# Patient Record
Sex: Female | Born: 1960 | Race: White | Hispanic: No | Smoking: Never smoker
Health system: Southern US, Community
[De-identification: ages and names within clinical notes are randomized; demographics above are authoritative.]

## PROBLEM LIST (undated history)

## (undated) DIAGNOSIS — L570 Actinic keratosis: Secondary | ICD-10-CM

## (undated) DIAGNOSIS — K589 Irritable bowel syndrome without diarrhea: Secondary | ICD-10-CM

## (undated) HISTORY — DX: Actinic keratosis: L57.0

---

## 1999-09-15 ENCOUNTER — Other Ambulatory Visit: Admission: RE | Admit: 1999-09-15 | Discharge: 1999-09-15 | Payer: Self-pay | Admitting: Obstetrics and Gynecology

## 2000-09-12 ENCOUNTER — Other Ambulatory Visit: Admission: RE | Admit: 2000-09-12 | Discharge: 2000-09-12 | Payer: Self-pay | Admitting: Obstetrics and Gynecology

## 2001-04-16 ENCOUNTER — Ambulatory Visit (HOSPITAL_COMMUNITY): Admission: RE | Admit: 2001-04-16 | Discharge: 2001-04-16 | Payer: Self-pay | Admitting: Gastroenterology

## 2001-04-16 ENCOUNTER — Encounter: Payer: Self-pay | Admitting: Gastroenterology

## 2001-04-18 ENCOUNTER — Encounter: Payer: Self-pay | Admitting: Gastroenterology

## 2001-04-18 ENCOUNTER — Ambulatory Visit (HOSPITAL_COMMUNITY): Admission: RE | Admit: 2001-04-18 | Discharge: 2001-04-18 | Payer: Self-pay | Admitting: Gastroenterology

## 2001-10-10 ENCOUNTER — Other Ambulatory Visit: Admission: RE | Admit: 2001-10-10 | Discharge: 2001-10-10 | Payer: Self-pay | Admitting: Obstetrics and Gynecology

## 2002-06-04 ENCOUNTER — Encounter: Admission: RE | Admit: 2002-06-04 | Discharge: 2002-06-04 | Payer: Self-pay | Admitting: Gastroenterology

## 2002-06-04 ENCOUNTER — Encounter: Payer: Self-pay | Admitting: Gastroenterology

## 2002-06-18 ENCOUNTER — Other Ambulatory Visit: Admission: RE | Admit: 2002-06-18 | Discharge: 2002-06-18 | Payer: Self-pay | Admitting: Obstetrics and Gynecology

## 2002-09-09 ENCOUNTER — Ambulatory Visit (HOSPITAL_COMMUNITY): Admission: RE | Admit: 2002-09-09 | Discharge: 2002-09-09 | Payer: Self-pay | Admitting: Gastroenterology

## 2002-10-15 ENCOUNTER — Other Ambulatory Visit: Admission: RE | Admit: 2002-10-15 | Discharge: 2002-10-15 | Payer: Self-pay | Admitting: Obstetrics and Gynecology

## 2003-11-09 ENCOUNTER — Other Ambulatory Visit: Admission: RE | Admit: 2003-11-09 | Discharge: 2003-11-09 | Payer: Self-pay | Admitting: Obstetrics and Gynecology

## 2004-01-24 ENCOUNTER — Encounter: Admission: RE | Admit: 2004-01-24 | Discharge: 2004-01-24 | Payer: Self-pay | Admitting: Cardiology

## 2004-04-13 ENCOUNTER — Ambulatory Visit: Payer: Self-pay | Admitting: Internal Medicine

## 2004-04-24 ENCOUNTER — Ambulatory Visit: Payer: Self-pay | Admitting: Family Medicine

## 2004-06-23 ENCOUNTER — Ambulatory Visit: Payer: Self-pay | Admitting: Cardiology

## 2005-01-11 ENCOUNTER — Other Ambulatory Visit: Admission: RE | Admit: 2005-01-11 | Discharge: 2005-01-11 | Payer: Self-pay | Admitting: Obstetrics and Gynecology

## 2005-07-25 ENCOUNTER — Ambulatory Visit: Payer: Self-pay | Admitting: Cardiology

## 2005-08-07 ENCOUNTER — Ambulatory Visit: Payer: Self-pay | Admitting: Cardiology

## 2005-09-14 ENCOUNTER — Ambulatory Visit: Payer: Self-pay | Admitting: Family Medicine

## 2005-09-19 ENCOUNTER — Ambulatory Visit: Payer: Self-pay | Admitting: Cardiology

## 2006-08-28 ENCOUNTER — Ambulatory Visit: Payer: Self-pay | Admitting: Cardiology

## 2006-08-28 LAB — CONVERTED CEMR LAB
ALT: 19 units/L (ref 0–40)
AST: 23 units/L (ref 0–37)
Albumin: 4 g/dL (ref 3.5–5.2)
Alkaline Phosphatase: 50 units/L (ref 39–117)
Bilirubin, Direct: 0.1 mg/dL (ref 0.0–0.3)
Cholesterol: 157 mg/dL (ref 0–200)
HDL: 42.6 mg/dL (ref >39.0)
LDL Cholesterol: 83 mg/dL (ref 0–99)
Total Bilirubin: 0.8 mg/dL (ref 0.3–1.2)
Total CHOL/HDL Ratio: 3.7
Total Protein: 7.3 g/dL (ref 6.0–8.3)
Triglycerides: 155 mg/dL — ABNORMAL HIGH (ref 0–149)
VLDL: 31 mg/dL (ref 0–40)

## 2006-09-06 ENCOUNTER — Ambulatory Visit: Payer: Self-pay | Admitting: Cardiology

## 2007-10-22 ENCOUNTER — Ambulatory Visit: Payer: Self-pay | Admitting: Cardiology

## 2007-10-22 LAB — CONVERTED CEMR LAB
ALT: 21 units/L (ref 0–35)
AST: 28 units/L (ref 0–37)
Albumin: 4 g/dL (ref 3.5–5.2)
Alkaline Phosphatase: 50 units/L (ref 39–117)
Bilirubin, Direct: 0.1 mg/dL (ref 0.0–0.3)
Cholesterol: 163 mg/dL (ref 0–200)
Direct LDL: 94.1 mg/dL
HDL: 40.7 mg/dL (ref >39.0)
Total Bilirubin: 0.7 mg/dL (ref 0.3–1.2)
Total CHOL/HDL Ratio: 4
Total Protein: 7.3 g/dL (ref 6.0–8.3)
Triglycerides: 237 mg/dL (ref 0–149)
VLDL: 47 mg/dL — ABNORMAL HIGH (ref 0–40)

## 2007-10-29 ENCOUNTER — Ambulatory Visit: Payer: Self-pay | Admitting: Cardiology

## 2008-10-07 ENCOUNTER — Telehealth: Payer: Self-pay | Admitting: Cardiology

## 2008-11-05 ENCOUNTER — Ambulatory Visit: Payer: Self-pay | Admitting: Cardiology

## 2008-11-05 LAB — CONVERTED CEMR LAB
ALT: 20 units/L (ref 0–35)
AST: 22 units/L (ref 0–37)
Albumin: 4.4 g/dL (ref 3.5–5.2)
Alkaline Phosphatase: 51 units/L (ref 39–117)
Bilirubin, Direct: 0 mg/dL (ref 0.0–0.3)
Cholesterol: 143 mg/dL (ref 0–200)
HDL: 46.7 mg/dL (ref >39.00)
Hgb A1c MFr Bld: 5.4 % (ref 4.6–6.5)
LDL Cholesterol: 63 mg/dL (ref 0–99)
Total Bilirubin: 1 mg/dL (ref 0.3–1.2)
Total CHOL/HDL Ratio: 3
Total Protein: 7.9 g/dL (ref 6.0–8.3)
Triglycerides: 166 mg/dL — ABNORMAL HIGH (ref 0.0–149.0)
VLDL: 33.2 mg/dL (ref 0.0–40.0)

## 2008-11-08 DIAGNOSIS — E785 Hyperlipidemia, unspecified: Secondary | ICD-10-CM | POA: Insufficient documentation

## 2008-11-11 ENCOUNTER — Ambulatory Visit: Payer: Self-pay | Admitting: Cardiology

## 2009-09-23 ENCOUNTER — Telehealth: Payer: Self-pay | Admitting: Cardiology

## 2009-10-26 ENCOUNTER — Ambulatory Visit: Payer: Self-pay | Admitting: Cardiology

## 2009-10-28 ENCOUNTER — Ambulatory Visit: Payer: Self-pay | Admitting: Cardiology

## 2009-10-28 DIAGNOSIS — I10 Essential (primary) hypertension: Secondary | ICD-10-CM | POA: Insufficient documentation

## 2009-10-28 LAB — CONVERTED CEMR LAB
ALT: 17 units/L (ref 0–35)
AST: 21 units/L (ref 0–37)
Albumin: 4.1 g/dL (ref 3.5–5.2)
Alkaline Phosphatase: 53 units/L (ref 39–117)
Bilirubin, Direct: 0.1 mg/dL (ref 0.0–0.3)
Cholesterol: 146 mg/dL (ref 0–200)
Direct LDL: 74.9 mg/dL
HDL: 42.2 mg/dL (ref >39.00)
Total Bilirubin: 0.6 mg/dL (ref 0.3–1.2)
Total CHOL/HDL Ratio: 3
Total Protein: 7.2 g/dL (ref 6.0–8.3)
Triglycerides: 244 mg/dL — ABNORMAL HIGH (ref 0.0–149.0)
VLDL: 48.8 mg/dL — ABNORMAL HIGH (ref 0.0–40.0)

## 2010-04-18 DIAGNOSIS — D239 Other benign neoplasm of skin, unspecified: Secondary | ICD-10-CM

## 2010-04-18 HISTORY — DX: Other benign neoplasm of skin, unspecified: D23.9

## 2010-06-09 ENCOUNTER — Encounter
Admission: RE | Admit: 2010-06-09 | Discharge: 2010-06-09 | Payer: Self-pay | Source: Home / Self Care | Attending: Obstetrics and Gynecology | Admitting: Obstetrics and Gynecology

## 2010-06-27 NOTE — Progress Notes (Signed)
Summary: lab work prior to appt ***LMTCB***/al  Phone Note Call from Patient Call back at Home Phone [phone]   Caller: Patient Reason for Call: Talk to Nurse Summary of Call: pt would like to have lab work prior to appt Initial call taken by: Lorne Skeens,  September 23, 2009 3:23 PM  Follow-up for Phone Call        [facility] Katina Dung, RN, BSN  September 23, 2009 3:47 PM  [facility] Scherrie Bateman, LPN  Sep 26, 1476 8:58 AM [facility] Scherrie Bateman, LPN  Sep 28, 2954 8:59 AM PT CALLED BACK AND LABS SCHEDULED FOR 10/26/09 AT 8:30 Follow-up by: Scherrie Bateman, LPN,  Sep 27, 2128 11:57 AM

## 2010-06-27 NOTE — Assessment & Plan Note (Signed)
Summary: F1Y/ANAS   Visit Type:  1 yr f/u Referring Provider:  Dr. Henderson Cloud Primary Provider:  Harold Hedge  CC:  no cardiac complaints today.  History of Present Illness: Mrs. Twiggs comes in today for followup of her history of mixed hyperlipidemia, being overweight, and borderline hypertension.  She is now walking on a regular basis. Her weight is unchanged. She does eat white cards and drinks older one on the weekend.  Her triglycerides are still high at 250 range. her HDL is 42. Total cholesterol and LDL are goal.  Her blood pressure down a great control. Patient denies any symptoms of angina or ischemia. She had no symptoms of TIAs.  Current Medications (verified): 1)  Simvastatin 40 Mg Tabs (Simvastatin) .Marland Kitchen.. 1 Tab Once Daily 2)  Bcp .... Use As Directed 3)  Multivitamins   Tabs (Multiple Vitamin) .Marland Kitchen.. 1 Tab Once Daily 4)  Calcium 500 Mg Tabs (Calcium Carbonate) .Marland Kitchen.. 1 Tab Once Daily 5)  Claritin 10 Mg Tabs (Loratadine) .Marland Kitchen.. 1 Tab Once Daily  Allergies: 1)  ! Codeine  Past History:  Past Medical History: Last updated: 11/08/2008 CAROTID ARTERY DISEASE (ICD-433.10) CAD, UNSPECIFIED SITE (ICD-414.00) HYPERLIPIDEMIA-MIXED (ICD-272.4)  Past Surgical History: Last updated: 11/08/2008 Colonoscopy... 09/09/2002 Tonsillectomy...1970  Family History: Last updated: 11/11/2008  Family History of Coronary Artery Disease:  Family History of CVA or Stroke:   Social History: Last updated: 11/08/2008 Tobacco Use - Former. quit 1980 Married  Alcohol Use - yes Regular Exercise - yes.Vanetta Shawl Drug Use - no  Risk Factors: Exercise: yes (11/08/2008)  Risk Factors: Smoking Status: quit (11/08/2008)  Review of Systems       negative other than history of present illness  Vital Signs:  Patient profile:   50 year old female Height:      65.5 inches Weight:      162 pounds BMI:     26.64 Pulse rate:   82 / minute Pulse rhythm:   regular BP sitting:   110 / 60   (left arm) Cuff size:   large  Vitals Entered By: Danielle Rankin, CMA (October 28, 2009 3:19 PM)  Physical Exam  General:  very pleasant, overweight Head:  normocephalic and atraumatic Eyes:  PERRLA/EOM intact; conjunctiva and lids normal. Neck:  Neck supple, no JVD. No masses, thyromegaly or abnormal cervical nodes. Chest Nekhi Liwanag:  no deformities or breast masses noted Lungs:  Clear bilaterally to auscultation and percussion. Heart:  Non-displaced PMI, chest non-tender; regular rate and rhythm, S1, S2 without murmurs, rubs or gallops. Carotid upstroke normal, no bruit. Normal abdominal aortic size, no bruits. Femorals normal pulses, no bruits. Pedals normal pulses. No edema, no varicosities. Abdomen:  Bowel sounds positive; abdomen soft and non-tender without masses, organomegaly, or hernias noted. No hepatosplenomegaly. Msk:  Back normal, normal gait. Muscle strength and tone normal. Pulses:  pulses normal in all 4 extremities Extremities:  No clubbing or cyanosis. Neurologic:  Alert and oriented x 3. Skin:  Intact without lesions or rashes. Psych:  Normal affect.   Problems:  Medical Problems Added: 1)  Dx of Hypertension, Benign  (ICD-401.1)  EKG  Procedure date:  10/28/2009  Findings:      normal sinus rhythm, normal EKG  Impression & Recommendations:  Problem # 1:  HYPERLIPIDEMIA-MIXED (ICD-272.4) Assessment Improved I have reviewed her numbers which were just obtained on June 1. Total cholesterol 146, Travelers rest 244, HDL 42, VLDL 48.8, LDL 74.9. Her LFTs are normal.  I have strongly recommended she reduce her  white cards, any sweets which she says are infrequent, and she really doesn't drink much alcohol except a few glasses of wine on the weekend. Encourage weight loss and exercise and 3 hours per week. We talked about Niaspan but will stay away from it for now. Her updated medication list for this problem includes:    Simvastatin 40 Mg Tabs (Simvastatin) .Marland Kitchen... 1 tab  once daily  Orders: EKG w/ Interpretation (93000)  Problem # 2:  HYPERTENSION, BENIGN (ICD-401.1) Assessment: Improved  Patient Instructions: 1)  Your physician recommends that you schedule a follow-up appointment in: YEAR WITH DR Tandrea Kommer 2)  Your physician recommends that you continue on your current medications as directed. Please refer to the Current Medication list given to you today. Prescriptions: SIMVASTATIN 40 MG TABS (SIMVASTATIN) 1 tab once daily  #90 x 3   Entered by:   Scherrie Bateman, LPN   Authorized by:   Gaylord Shih, MD, [facility]   Signed by:   Scherrie Bateman, LPN on 84/69/6295   Method used:   Faxed to ...       [facility] (mail-order)       [address]       [place], [place]  [address]       Ph: [phone]       Fax: [phone]   RxID:   [id number]

## 2010-10-10 NOTE — Assessment & Plan Note (Signed)
[facility]                            CARDIOLOGY OFFICE NOTE   Morgan David                     MRN:          [id number]  DATE:10/29/2007                            DOB:          09/25/1960    Morgan David returns today for further management of her hyperlipidemia  and family history of coronary disease and carotid disease.   She has lost 2 pounds.  She has been doing a lot of yard work and she  tries to walk.  She does like carbs and it is reflected her most recent  lipid panel.  Her triglycerides went from 155-237.  Her cholesterol is  163, HDL has gone up though 6 points to 40.7.  VLDL was high at 47, her  LDL is 94.1.  This is on simvastatin 40 mg a day.  Her LFTs are normal.   She is having no angina or ischemic equivalents.   CURRENT MEDICATIONS:  1. Lo/Ovral.  2. Valtrex.  3. Multivitamin.  4. Simvastatin 40 nightly.   PHYSICAL EXAMINATION:  VITAL SIGNS:  Her blood pressure is 124/72, her  pulse is 81 and regular.  Weight is 165.  HEENT:  Unchanged.  NECK:  Carotids upstrokes are equal bilaterally without bruits.  No JVD.  Thyroid is not enlarged.  Trachea is midline.  LUNGS:  Clear.  HEART:  Reveals a regular rate and rhythm.  No gallop.  ABDOMEN:  Soft, good bowel sounds.  No midline bruit.  EXTREMITIES:  No cyanosis, clubbing or edema.  Pulses are intact.  NEURO:  Intact.   EKG shows sinus rhythm with nonspecific T-wave changes.   ASSESSMENT/PLAN:  I have complemented Morgan David on her weight control  and exercise plan.  She needs to watch carbs.  Will continue with  simvastatin, which I have renewed.  I will see her back in a year.     Thomas C. Daleen Squibb, MD, [facility]  Electronically Signed    TCW/MedQ  DD: 10/29/2007  DT: 10/29/2007  Job #: [id number]   cc:   Guy Sandifer. Henderson Cloud, M.D.

## 2010-10-13 NOTE — Op Note (Signed)
NAME:  Morgan David, Morgan David                        ACCOUNT NO.:  [id number]   MEDICAL RECORD NO.:  [id number]                   PATIENT TYPE:  AMB   LOCATION:  ENDO                                 FACILITY:  [facility]   PHYSICIAN:  Anselmo Rod, M.D.               DATE OF BIRTH:  01/10/1961   DATE OF PROCEDURE:  09/09/2002  DATE OF DISCHARGE:                                 OPERATIVE REPORT   PROCEDURE:  Colonoscopy.   ENDOSCOPIST:  Anselmo Rod, M.D.   INSTRUMENT USED:  Olympus video colonoscope.   INDICATIONS FOR PROCEDURE:  Change in bowel habits with abdominal pain in a  50 year old female. Rule out colonic polyps, masses, etc.   PREPROCEDURE PREPARATION:  Informed consent was obtained from the patient  and the patient was  fasted for 8 hours prior to  the procedure and prepped  with a bottle of MiraLax and Gatorade the  night prior to the procedure.   PREPROCEDURE PHYSICAL:  The patient had stable vital signs. Neck supple.  Chest clear to auscultation, normal S1, S2. Abdomen soft with normoactive  bowel sounds.   DESCRIPTION OF PROCEDURE:  The patient was placed in the left lateral  decubitus position and sedated with 100 mg of Demerol and 2 mg of Versed  intravenously. Once sedation was adequate, the patient was maintained on low  flow oxygen and continuous cardiac monitoring.   The Olympus video colonoscope was advanced into the rectum to the cecum and  terminal ileum with slight difficulty. The patient had some abdominal  discomfort from insufflation of the air into the colon, indicating visceral  hypersensitivity. No masses, polyps, erosions or ulcerations or diverticula  were seen. Reflexion in the rectum revealed small nonbleeding internal  hemorrhoids. The entire colonic mucosa up to the terminal ileum appeared  normal without lesions.   IMPRESSION:  1. Normal colonoscopy to the terminal ileum except for small nonbleeding     internal hemorrhoids. No masses or  polyps seen.  2. Abdominal discomfort with insufflation of air indicating visceral     hypersensitivity consistent with irritable bowel syndrome.            RECOMMENDATIONS:  1. Repeat colorectal cancer screening is recommended at the age of 11     unless the patient develops any abnormal symptoms in the interim.  2. High fiber diet with liberal fluid intake.  3. Outpatient followup in the next 2 weeks for further recommendations.                                                   Anselmo Rod, M.D.    JNM/MEDQ  D:  09/09/2002  T:  09/09/2002  Job:  [id number]   cc:   Isla Pence, M.D.  [facility]  [address]  [place]  [place] [address]

## 2010-10-13 NOTE — Assessment & Plan Note (Signed)
[facility]                            CARDIOLOGY OFFICE NOTE   Morgan David, Morgan David                     MRN:          [id number]  DATE:09/06/2006                            DOB:          May 08, 1961    Ms. Privott returns today for further management of her hyperlipidemia  and family history of ischemic heart disease and stroke.   We switched her from Vytorin to simvastatin 40 mg nightly for cost  savings.  Her cholesterol panel on August 28, 2006 shows total cholesterol  157, triglycerides 155, HDL increased from 34.7 to 42.6, LDL stayed low  at 83.   Her weight is actually up 7 pounds, but she is exercising on a regular  basis.  She walks at least 3 hours a week.   Her blood pressure today is 108/76, pulse 87 and regular.  HEENT:  Normocephalic, atraumatic.  PERRLA.  Extraocular muscles are  intact.  Sclerae clear.  Facial symmetry is normal.  Carotids are full.  There are no bruits.  There is no JVD.  Thyroid is  not enlarged.  Trachea is midline.  LUNGS:  Clear.  HEART:  Nondisplaced PMI.  Normal S1, S2.  ABDOMEN:  Soft.  Good bowel sounds.  No midline bruit.  No hepatomegaly.  EXTREMITIES:  No cyanosis, clubbing, or edema.  Pulses are brisk.  NEURO:  Intact.   ELECTROCARDIOGRAM:  Normal, except for some nonspecific ST segment  changes.   I have had a nice chat with Mrs. Traughber.  We renewed her simvastatin  for a year.  She will need followup blood work in another year as well.  I will see her back at that time.     Thomas C. Daleen Squibb, MD, [facility]  Electronically Signed    TCW/MedQ  DD: 09/06/2006  DT: 09/06/2006  Job #: [id number]   cc:   Guy Sandifer. Henderson Cloud, M.D.

## 2013-01-21 DIAGNOSIS — D229 Melanocytic nevi, unspecified: Secondary | ICD-10-CM

## 2013-01-21 HISTORY — DX: Melanocytic nevi, unspecified: D22.9

## 2019-01-15 ENCOUNTER — Ambulatory Visit: Payer: Self-pay | Admitting: Podiatry

## 2019-01-19 ENCOUNTER — Encounter: Payer: Self-pay | Admitting: Podiatry

## 2019-01-19 ENCOUNTER — Ambulatory Visit (INDEPENDENT_AMBULATORY_CARE_PROVIDER_SITE_OTHER): Payer: BC Managed Care – PPO | Admitting: Podiatry

## 2019-01-19 ENCOUNTER — Other Ambulatory Visit: Payer: Self-pay

## 2019-01-19 DIAGNOSIS — L608 Other nail disorders: Secondary | ICD-10-CM

## 2019-01-19 DIAGNOSIS — L600 Ingrowing nail: Secondary | ICD-10-CM | POA: Insufficient documentation

## 2019-01-19 NOTE — Progress Notes (Signed)
This patient presents the office with chief complaint of a painful toenail on her right big toe she says the tip of the outside border of her right big toe is very painful .  She says she has been experiencing on and off pain for approximately 2 months.  She says she has been soaking it in Epson salts but minimal relief is noted.  She denies any redness swelling or drainage noted from the painful site.  She presents the office today for an evaluation and treatment of this painful nail right hallux.  Vascular  Dorsalis pedis and posterior tibial pulses are palpable  B/L.  Capillary return  WNL.  Temperature gradient is  WNL.  Skin turgor  WNL  Sensorium  Senn Weinstein monofilament wire  WNL. Normal tactile sensation.  Nail Exam  Patient has normal nails with no evidence of bacterial or fungal infection. Pain noted at the distal aspect right hallux.  No redness or swelling.  Orthopedic  Exam  Muscle tone and muscle strength  WNL.  No limitations of motion feet  B/L.  No crepitus or joint effusion noted.  Foot type is unremarkable and digits show no abnormalities.  Bony prominences are unremarkable.  Skin  No open lesions.  Normal skin texture and turgor..  Ingrown nail.  Pincer nail right hallux.    IE.  Nail surgery.  Treatment options and alternatives discussed.  Recommended an incision and drainage and patient agreed. Right hallux  was prepped with alcohol and a 3cc. of  2% lidocaine plain was administered in a digital block fashion.   .  The offending nail border was then excised and all necrotic tissue was resected.  The area was then cleansed  and antibiotic ointment and a dry sterile dressing was applied.  The patient was dispensed instructions for aftercare. RTC prn.   Gardiner Barefoot DPM

## 2019-02-09 ENCOUNTER — Encounter: Payer: Self-pay | Admitting: Obstetrics and Gynecology

## 2019-09-04 ENCOUNTER — Other Ambulatory Visit: Payer: Self-pay | Admitting: Dermatology

## 2020-01-13 ENCOUNTER — Ambulatory Visit: Payer: Self-pay | Admitting: Dermatology

## 2020-04-25 ENCOUNTER — Other Ambulatory Visit: Payer: Self-pay | Admitting: Obstetrics and Gynecology

## 2020-04-25 DIAGNOSIS — R928 Other abnormal and inconclusive findings on diagnostic imaging of breast: Secondary | ICD-10-CM

## 2020-05-11 ENCOUNTER — Other Ambulatory Visit: Payer: Self-pay

## 2020-05-11 ENCOUNTER — Ambulatory Visit
Admission: RE | Admit: 2020-05-11 | Discharge: 2020-05-11 | Disposition: A | Discharge status: Home / Self Care | Payer: BC Managed Care – PPO | Source: Ambulatory Visit | Attending: Obstetrics and Gynecology | Admitting: Obstetrics and Gynecology

## 2020-05-11 DIAGNOSIS — R928 Other abnormal and inconclusive findings on diagnostic imaging of breast: Secondary | ICD-10-CM

## 2020-05-18 ENCOUNTER — Encounter: Payer: Self-pay | Admitting: Dermatology

## 2020-05-18 ENCOUNTER — Other Ambulatory Visit: Payer: Self-pay

## 2020-05-18 ENCOUNTER — Ambulatory Visit: Payer: BC Managed Care – PPO | Admitting: Dermatology

## 2020-05-18 DIAGNOSIS — Z1283 Encounter for screening for malignant neoplasm of skin: Secondary | ICD-10-CM | POA: Diagnosis not present

## 2020-05-18 DIAGNOSIS — L814 Other melanin hyperpigmentation: Secondary | ICD-10-CM

## 2020-05-18 DIAGNOSIS — L570 Actinic keratosis: Secondary | ICD-10-CM | POA: Diagnosis not present

## 2020-05-18 DIAGNOSIS — L820 Inflamed seborrheic keratosis: Secondary | ICD-10-CM

## 2020-05-18 DIAGNOSIS — B351 Tinea unguium: Secondary | ICD-10-CM | POA: Diagnosis not present

## 2020-05-18 DIAGNOSIS — D1800 Hemangioma unspecified site: Secondary | ICD-10-CM

## 2020-05-18 DIAGNOSIS — L578 Other skin changes due to chronic exposure to nonionizing radiation: Secondary | ICD-10-CM

## 2020-05-18 DIAGNOSIS — L821 Other seborrheic keratosis: Secondary | ICD-10-CM

## 2020-05-18 DIAGNOSIS — D229 Melanocytic nevi, unspecified: Secondary | ICD-10-CM

## 2020-05-18 MED ORDER — JUBLIA 10 % EX SOLN: CUTANEOUS | 11 refills | Status: DC

## 2020-05-18 NOTE — Progress Notes (Signed)
   Follow-Up Visit   Subjective  Morgan David is a 59 y.o. female who presents for the following: Annual Exam (Hx dysplastic nevi - patient has an irritated skin lesion on her neck that gets caught on clothing/jewelry that she would like treated ). The patient presents for Total-Body Skin Exam (TBSE) for skin cancer screening and mole check.  The following portions of the chart were reviewed this encounter and updated as appropriate:   Tobacco  Allergies  Meds  Problems  Med Hx  Surg Hx  Fam Hx     Review of Systems:  No other skin or systemic complaints except as noted in HPI or Assessment and Plan.  Objective  Well appearing patient in no apparent distress; mood and affect are within normal limits.  A full examination was performed including scalp, head, eyes, ears, nose, lips, neck, chest, axillae, abdomen, back, buttocks, bilateral upper extremities, bilateral lower extremities, hands, feet, fingers, toes, fingernails, and toenails. All findings within normal limits unless otherwise noted below.  Objective  L neck x 1, L forearm x 1: Pedunculated erythematous keratotic or waxy stuck-on papule or plaque.   Objective  R upper lip x 1: Erythematous thin papules/macules with gritty scale.   Objective  R great toe: Toenail dystrophy    Assessment & Plan  Inflamed seborrheic keratosis L neck x 1, L forearm x 1 Destruction of lesion - L neck x 1, L forearm x 1 Complexity: simple   Destruction method: cryotherapy   Informed consent: discussed and consent obtained   Timeout:  patient name, date of birth, surgical site, and procedure verified Lesion destroyed using liquid nitrogen: Yes   Region frozen until ice ball extended beyond lesion: Yes   Outcome: patient tolerated procedure well with no complications   Post-procedure details: wound care instructions given    AK (actinic keratosis) R upper lip x 1 Pigmented AK - R upper lip, plan to treat after the holiday  season.  Tinea unguium R great toe Chronic, persistent - Start Jublia QHS. Efinaconazole (JUBLIA) 10 % SOLN - R great toe  Skin cancer screening   Lentigines - Scattered tan macules - Discussed due to sun exposure - Benign, observe - Call for any changes  Seborrheic Keratoses - Stuck-on, waxy, tan-brown papules and plaques  - Discussed benign etiology and prognosis. - Observe - Call for any changes  Melanocytic Nevi - Tan-brown and/or pink-flesh-colored symmetric macules and papules - Benign appearing on exam today - Observation - Call clinic for new or changing moles - Recommend daily use of broad spectrum spf 30+ sunscreen to sun-exposed areas.   Hemangiomas - Red papules - Discussed benign nature - Observe - Call for any changes  Actinic Damage - Chronic, secondary to cumulative UV/sun exposure - diffuse scaly erythematous macules with underlying dyspigmentation - Recommend daily broad spectrum sunscreen SPF 30+ to sun-exposed areas, reapply every 2 hours as needed.  - Call for new or changing lesions.  Skin cancer screening performed today.  Return in about 2 months (around 07/19/2020).  Luther Redo, CMA, am acting as scribe for Sarina Ser, MD .  Documentation: I have reviewed the above documentation for accuracy and completeness, and I agree with the above.  Sarina Ser, MD

## 2020-05-26 ENCOUNTER — Encounter: Payer: Self-pay | Admitting: Dermatology

## 2020-07-25 ENCOUNTER — Ambulatory Visit: Payer: BC Managed Care – PPO | Admitting: Dermatology

## 2021-05-17 ENCOUNTER — Other Ambulatory Visit: Payer: Self-pay

## 2021-05-17 ENCOUNTER — Ambulatory Visit: Payer: BC Managed Care – PPO | Admitting: Dermatology

## 2021-05-17 ENCOUNTER — Encounter: Payer: Self-pay | Admitting: Dermatology

## 2021-05-17 DIAGNOSIS — Z7189 Other specified counseling: Secondary | ICD-10-CM | POA: Diagnosis not present

## 2021-05-17 DIAGNOSIS — L821 Other seborrheic keratosis: Secondary | ICD-10-CM

## 2021-05-17 DIAGNOSIS — L219 Seborrheic dermatitis, unspecified: Secondary | ICD-10-CM

## 2021-05-17 DIAGNOSIS — D1801 Hemangioma of skin and subcutaneous tissue: Secondary | ICD-10-CM

## 2021-05-17 DIAGNOSIS — L578 Other skin changes due to chronic exposure to nonionizing radiation: Secondary | ICD-10-CM | POA: Diagnosis not present

## 2021-05-17 DIAGNOSIS — L814 Other melanin hyperpigmentation: Secondary | ICD-10-CM

## 2021-05-17 DIAGNOSIS — L853 Xerosis cutis: Secondary | ICD-10-CM

## 2021-05-17 DIAGNOSIS — L720 Epidermal cyst: Secondary | ICD-10-CM

## 2021-05-17 DIAGNOSIS — Z86018 Personal history of other benign neoplasm: Secondary | ICD-10-CM

## 2021-05-17 DIAGNOSIS — B351 Tinea unguium: Secondary | ICD-10-CM

## 2021-05-17 DIAGNOSIS — D229 Melanocytic nevi, unspecified: Secondary | ICD-10-CM

## 2021-05-17 DIAGNOSIS — Z1283 Encounter for screening for malignant neoplasm of skin: Secondary | ICD-10-CM

## 2021-05-17 DIAGNOSIS — B009 Herpesviral infection, unspecified: Secondary | ICD-10-CM | POA: Diagnosis not present

## 2021-05-17 MED ORDER — TAVABOROLE 5 % EX SOLN: 1.0000 "application " | Freq: Every day | CUTANEOUS | 11 refills | Status: DC

## 2021-05-17 MED ORDER — VALACYCLOVIR HCL 1 G PO TABS: 1000.0000 mg | ORAL_TABLET | ORAL | 11 refills | Status: AC

## 2021-05-17 MED ORDER — TERBINAFINE HCL 250 MG PO TABS: 250.0000 mg | ORAL_TABLET | Freq: Every day | ORAL | 0 refills | Status: AC

## 2021-05-17 NOTE — Progress Notes (Addendum)
Established Patient Visit  Subjective  Morgan David is a 60 y.o. female who presents for the following: Total body skin exam (Hx of dysplastic nevi), check spot (Post neck, 88m, itchy), and Nail Problem (Toenails, has had 13m Lamisil in 08/22 prescribed by PCP). The patient presents for Total-Body Skin Exam (TBSE) for skin cancer screening and mole check.  The patient has spots, moles and lesions to be evaluated, some may be new or changing and the patient has concerns that these could be cancer.  The following portions of the chart were reviewed this encounter and updated as appropriate:   Tobacco   Allergies   Meds   Problems   Med Hx   Surg Hx   Fam Hx      Review of Systems:  No other skin or systemic complaints except as noted in HPI or Assessment and Plan.  Objective  Well appearing patient in no apparent distress; mood and affect are within normal limits.  A full examination was performed including scalp, head, eyes, ears, nose, lips, neck, chest, axillae, abdomen, back, buttocks, bilateral upper extremities, bilateral lower extremities, hands, feet, fingers, toes, fingernails, and toenails. All findings within normal limits unless otherwise noted below.  right prox lat elbow, left LQA 8.0 cm from umbilicus, R chest parasternal Scars with no evidence of recurrence.   Right Ear Pruritis otherwise clear  Right Upper Back 0.6cm cystic pap  toenails Toenail dystrophy  Lips Clear today   Assessment & Plan   Lentigines - Scattered tan macules - Due to sun exposure - Benign-appearing, observe - Recommend daily broad spectrum sunscreen SPF 30+ to sun-exposed areas, reapply every 2 hours as needed. - Call for any changes  Seborrheic Keratoses - Stuck-on, waxy, tan-brown papules and/or plaques  - Benign-appearing - Discussed benign etiology and prognosis. - Observe - Call for any changes  Melanocytic Nevi - Tan-brown and/or pink-flesh-colored symmetric macules and  papules - Benign appearing on exam today - Observation - Call clinic for new or changing moles - Recommend daily use of broad spectrum spf 30+ sunscreen to sun-exposed areas.   Hemangiomas - Red papules - Discussed benign nature - Observe - Call for any changes  Actinic Damage - Chronic condition, secondary to cumulative UV/sun exposure - diffuse scaly erythematous macules with underlying dyspigmentation - Recommend daily broad spectrum sunscreen SPF 30+ to sun-exposed areas, reapply every 2 hours as needed.  - Staying in the shade or wearing long sleeves, sun glasses (UVA+UVB protection) and wide brim hats (4-inch brim around the entire circumference of the hat) are also recommended for sun protection.  - Call for new or changing lesions.  Skin cancer screening performed today.  Xerosis - diffuse xerotic patches - recommend gentle, hydrating skin care - gentle skin care handout given - recommend Cerave cream  History of dysplastic nevus right prox lat elbow, left LQA 8.0 cm from umbilicus, R chest parasternal Clear. Observe for recurrence. Call clinic for new or changing lesions.  Recommend regular skin exams, daily broad-spectrum spf 30+ sunscreen use, and photoprotection.     Seborrheic dermatitis Right Ear Seborrheic Dermatitis  -  is a chronic persistent rash characterized by pinkness and scaling most commonly of the mid face but also can occur on the scalp (dandruff), ears; mid chest, mid back and groin.  It tends to be exacerbated by stress and cooler weather.  People who have neurologic disease may experience new onset or exacerbation of existing seborrheic dermatitis.  The condition is  not curable but treatable and can be controlled.  Start otc Hydrocortisone cream 3d/wk  Epidermal cyst Right Upper Back With hx of pruritis Benign, discussed excising  Tinea unguium toenails Chronic and persistent. No hx of liver problems Restart Lamisil 250mg  1 po qd #30, 0 rf  (pt took 1 month in 12/2020 prescribed by PCP) Start [place] Mon solution qd to toenails  Terbinafine Counseling  Terbinafine is an anti-fungal medicine that can be applied to the skin (over the counter) or taken by mouth (prescription) to treat fungal infections. The pill version is often used to treat fungal infections of the nails or scalp. While most people do not have any side effects from taking terbinafine pills, some possible side effects of the medicine can include taste changes, headache, loss of smell, vision changes, nausea, vomiting, or diarrhea.   Rare side effects can include irritation of the liver, allergic reaction, or decrease in blood counts (which may show up as not feeling well or developing an infection). If you are concerned about any of these side effects, please stop the medicine and call your doctor, or in the case of an emergency such as feeling very unwell, seek immediate medical care.    Tavaborole (KERYDIN) 5 % SOLN - toenails Apply 1 application topically at bedtime. Paint toenails qhs  terbinafine (LAMISIL) 250 MG tablet - toenails Take 1 tablet (250 mg total) by mouth daily.  HSV (herpes simplex virus) infection Lips Herpes Simplex Virus = Cold Sores = Fever Blisters is a chronic recurring blistering; scabbing sore-producing viral infection that is recurrent usually in the same area triggered by stress, sun/UV exposure and trauma.  It is infectious and can be spread from person to person by direct contact.  It is not curable, but is treatable with topical and oral medication.  Cont Valtrex 1g 2 po at onset of fever blisters and then 2 po 12 hours later  valACYclovir (VALTREX) 1000 MG tablet - Lips Take 1 tablet (1,000 mg total) by mouth as directed. 2 po at onset of fever blisters and 2 po 12 hours later  Return in about 1 year (around 05/17/2022) for TBSE, Hx of Dysplastic nevi.  I, Othelia Pulling, RMA, am acting as scribe for Sarina Ser, MD  . Documentation: I have reviewed the above documentation for accuracy and completeness, and I agree with the above.  Sarina Ser, MD

## 2021-05-17 NOTE — Patient Instructions (Addendum)
If You Need Anything After Your Visit ° °If you have any questions or concerns for your doctor, please call our main line at [phone] and press option 4 to reach your doctor's medical assistant. If no one answers, please leave a voicemail as directed and we will return your call as soon as possible. Messages left after 4 pm will be answered the following business day.  ° °You may also send us a message via MyChart. We typically respond to MyChart messages within 1-2 business days. ° °For prescription refills, please ask your pharmacy to contact our office. Our fax number is [phone]. ° °If you have an urgent issue when the clinic is closed that cannot wait until the next business day, you can page your doctor at the number below.   ° °Please note that while we do our best to be available for urgent issues outside of office hours, we are not available 24/7.  ° °If you have an urgent issue and are unable to reach us, you may choose to seek medical care at your doctor's office, retail clinic, urgent care center, or emergency room. ° °If you have a medical emergency, please immediately call 911 or go to the emergency department. ° °Pager Numbers ° °- Dr. Kowalski: [phone] ° °- Dr. Moye: [phone] ° °- Dr. Stewart: [phone] ° °In the event of inclement weather, please call our main line at [phone] for an update on the status of any delays or closures. ° °Dermatology Medication Tips: °Please keep the boxes that topical medications come in in order to help keep track of the instructions about where and how to use these. Pharmacies typically print the medication instructions only on the boxes and not directly on the medication tubes.  ° °If your medication is too expensive, please contact our office at [phone] option 4 or send us a message through MyChart.  ° °We are unable to tell what your co-pay for medications will be in advance as this is different depending on your insurance coverage.  However, we may be able to find a substitute medication at lower cost or fill out paperwork to get insurance to cover a needed medication.  ° °If a prior authorization is required to get your medication covered by your insurance company, please allow us 1-2 business days to complete this process. ° °Drug prices often vary depending on where the prescription is filled and some pharmacies may offer cheaper prices. ° °The website www.goodrx.com contains coupons for medications through different pharmacies. The prices here do not account for what the cost may be with help from insurance (it may be cheaper with your insurance), but the website can give you the price if you did not use any insurance.  °- You can print the associated coupon and take it with your prescription to the pharmacy.  °- You may also stop by our office during regular business hours and pick up a GoodRx coupon card.  °- If you need your prescription sent electronically to a different pharmacy, notify our office through [facility] MyChart or by phone at [phone] option 4. ° ° ° ° °Si Usted Necesita Algo Después de Su Visita ° °También puede enviarnos un mensaje a través de MyChart. Por lo general respondemos a los mensajes de MyChart en el transcurso de 1 a 2 días hábiles. ° °Para renovar recetas, por favor pida a su farmacia que se ponga en contacto con nuestra oficina. Nuestro número de fax es el [phone]. ° °Si tiene   un asunto urgente cuando la clnica est cerrada y que no puede esperar hasta el siguiente da hbil, puede llamar/localizar a su doctor(a) al nmero que aparece a continuacin.   Por favor, tenga en cuenta que aunque hacemos todo lo posible para estar disponibles para asuntos urgentes fuera del horario de [place], no estamos disponibles las 24 horas del da, los 7 das de la [place].   Si tiene un problema urgente y no puede comunicarse con nosotros, puede optar por buscar atencin mdica  en el consultorio de su  doctor(a), en una clnica privada, en un centro de atencin urgente o en una sala de emergencias.  Si tiene Engineering geologist, por favor llame inmediatamente al 911 o vaya a la sala de emergencias.  Nmeros de bper  - Dr. Nehemiah Massed: [phone]  - Dra. Moye: [phone]  - Dra. Nicole Kindred: [phone]  En caso de inclemencias del [place], por favor llame a Johnsie Kindred principal al [phone] para una actualizacin sobre el [place] de cualquier retraso o cierre.  Consejos para la medicacin en dermatologa: Por favor, guarde las cajas en las que vienen los medicamentos de uso tpico para ayudarle a seguir las instrucciones sobre dnde y cmo usarlos. Las farmacias generalmente imprimen las instrucciones del medicamento slo en las cajas y no directamente en los tubos del [place].   Si su medicamento es muy caro, por favor, pngase en contacto con Zigmund Daniel llamando al [phone] y presione la opcin 4 o envenos un mensaje a travs de Pharmacist, community.   No podemos decirle cul ser su copago por los medicamentos por adelantado ya que esto es diferente dependiendo de la cobertura de su seguro. Sin embargo, es posible que podamos encontrar un medicamento sustituto a Electrical engineer un formulario para que el seguro cubra el medicamento que se considera necesario.   Si se requiere una autorizacin previa para que su compaa de seguros [place] su medicamento, por favor permtanos de 1 a 2 das hbiles para completar este proceso.  Los precios de los medicamentos varan con frecuencia dependiendo del Environmental consultant de dnde se surte la receta y alguna farmacias pueden ofrecer precios ms baratos.  El sitio web www.goodrx.com tiene cupones para medicamentos de Airline pilot. Los precios aqu no tienen en cuenta lo que podra costar con la ayuda del seguro (puede ser ms barato con su seguro), pero el sitio web puede darle el precio si no utiliz Research scientist (physical sciences).  - Puede imprimir el cupn  correspondiente y llevarlo con su receta a la farmacia.  - Tambin puede pasar por nuestra oficina durante el horario de atencin regular y Charity fundraiser una tarjeta de cupones de GoodRx.  - Si necesita que su receta se enve electrnicamente a una farmacia diferente, informe a nuestra oficina a travs de MyChart de  o por telfono llamando al [phone] y presione la opcin 4.  Cerave cream for dryness

## 2021-05-28 ENCOUNTER — Encounter: Payer: Self-pay | Admitting: Dermatology

## 2021-05-30 NOTE — Addendum Note (Signed)
Addended by: Ralene Bathe on: 05/30/2021 09:05 AM   Modules accepted: Level of Service

## 2021-05-31 ENCOUNTER — Other Ambulatory Visit: Payer: Self-pay

## 2021-05-31 DIAGNOSIS — B351 Tinea unguium: Secondary | ICD-10-CM

## 2021-05-31 MED ORDER — TAVABOROLE 5 % EX SOLN: 1.0000 "application " | Freq: Every day | CUTANEOUS | 11 refills | Status: AC

## 2021-05-31 NOTE — Progress Notes (Signed)
Kerydin Solution not covered at [employer]. RX sent to [facility] for cash pay option. aw

## 2021-06-13 ENCOUNTER — Telehealth: Payer: Self-pay

## 2021-06-13 NOTE — Telephone Encounter (Signed)
Appointment August 14 2021

## 2021-08-14 ENCOUNTER — Encounter: Payer: Self-pay | Admitting: Gastroenterology

## 2021-08-14 ENCOUNTER — Other Ambulatory Visit: Payer: Self-pay

## 2021-08-14 ENCOUNTER — Ambulatory Visit (INDEPENDENT_AMBULATORY_CARE_PROVIDER_SITE_OTHER): Payer: BC Managed Care – PPO | Admitting: Gastroenterology

## 2021-08-14 VITALS — BP 137/77 | HR 69 | Temp 97.8°F | Ht 65.0 in | Wt 164.2 lb

## 2021-08-14 DIAGNOSIS — M2550 Pain in unspecified joint: Secondary | ICD-10-CM | POA: Insufficient documentation

## 2021-08-14 DIAGNOSIS — R279 Unspecified lack of coordination: Secondary | ICD-10-CM | POA: Insufficient documentation

## 2021-08-14 DIAGNOSIS — F419 Anxiety disorder, unspecified: Secondary | ICD-10-CM | POA: Insufficient documentation

## 2021-08-14 DIAGNOSIS — R609 Edema, unspecified: Secondary | ICD-10-CM | POA: Insufficient documentation

## 2021-08-14 DIAGNOSIS — M79609 Pain in unspecified limb: Secondary | ICD-10-CM | POA: Insufficient documentation

## 2021-08-14 DIAGNOSIS — S63519A Sprain of carpal joint of unspecified wrist, initial encounter: Secondary | ICD-10-CM | POA: Insufficient documentation

## 2021-08-14 DIAGNOSIS — S82209A Unspecified fracture of shaft of unspecified tibia, initial encounter for closed fracture: Secondary | ICD-10-CM | POA: Insufficient documentation

## 2021-08-14 DIAGNOSIS — R7301 Impaired fasting glucose: Secondary | ICD-10-CM | POA: Insufficient documentation

## 2021-08-14 DIAGNOSIS — K589 Irritable bowel syndrome without diarrhea: Secondary | ICD-10-CM | POA: Diagnosis not present

## 2021-08-14 DIAGNOSIS — Z6827 Body mass index (BMI) 27.0-27.9, adult: Secondary | ICD-10-CM | POA: Insufficient documentation

## 2021-08-14 DIAGNOSIS — Z1211 Encounter for screening for malignant neoplasm of colon: Secondary | ICD-10-CM

## 2021-08-14 DIAGNOSIS — R262 Difficulty in walking, not elsewhere classified: Secondary | ICD-10-CM | POA: Insufficient documentation

## 2021-08-14 DIAGNOSIS — J309 Allergic rhinitis, unspecified: Secondary | ICD-10-CM | POA: Insufficient documentation

## 2021-08-14 DIAGNOSIS — E559 Vitamin D deficiency, unspecified: Secondary | ICD-10-CM | POA: Insufficient documentation

## 2021-08-14 DIAGNOSIS — E039 Hypothyroidism, unspecified: Secondary | ICD-10-CM | POA: Insufficient documentation

## 2021-08-14 DIAGNOSIS — M25569 Pain in unspecified knee: Secondary | ICD-10-CM | POA: Insufficient documentation

## 2021-08-14 DIAGNOSIS — M6281 Muscle weakness (generalized): Secondary | ICD-10-CM | POA: Insufficient documentation

## 2021-08-14 MED ORDER — NA SULFATE-K SULFATE-MG SULF 17.5-3.13-1.6 GM/177ML PO SOLN: 1.0000 | Freq: Once | ORAL | 0 refills | Status: AC

## 2021-08-14 NOTE — Patient Instructions (Signed)
High-Fiber Eating Plan °Fiber, also called dietary fiber, is a type of carbohydrate. It is found foods such as fruits, vegetables, whole grains, and beans. A high-fiber diet can have many health benefits. Your health care provider may recommend a high-fiber diet to help: °Prevent constipation. Fiber can make your bowel movements more regular. °Lower your cholesterol. °Relieve the following conditions: °Inflammation of veins in the anus (hemorrhoids). °Inflammation of specific areas of the digestive tract (uncomplicated diverticulosis). °A problem of the large intestine, also called the colon, that sometimes causes pain and diarrhea (irritable bowel syndrome, or IBS). °Prevent overeating as part of a weight-loss plan. °Prevent heart disease, type 2 diabetes, and certain cancers. °What are tips for following this plan? °Reading food labels ° °Check the nutrition facts label on food products for the amount of dietary fiber. Choose foods that have 5 grams of fiber or more per serving. °The goals for recommended daily fiber intake include: °Men (age 50 or younger): 34-38 g. °Men (over age 50): 28-34 g. °Women (age 50 or younger): 25-28 g. °Women (over age 50): 22-25 g. °Your daily fiber goal is _____________ g. °Shopping °Choose whole fruits and vegetables instead of processed forms, such as apple juice or applesauce. °Choose a wide variety of high-fiber foods such as avocados, lentils, oats, and kidney beans. °Read the nutrition facts label of the foods you choose. Be aware of foods with added fiber. These foods often have high sugar and sodium amounts per serving. °Cooking °Use whole-grain flour for baking and cooking. °Cook with brown rice instead of white rice. °Meal planning °Start the day with a breakfast that is high in fiber, such as a cereal that contains 5 g of fiber or more per serving. °Eat breads and cereals that are made with whole-grain flour instead of refined flour or white flour. °Eat brown rice, bulgur  wheat, or millet instead of white rice. °Use beans in place of meat in soups, salads, and pasta dishes. °Be sure that half of the grains you eat each day are whole grains. °General information °You can get the recommended daily intake of dietary fiber by: °Eating a variety of fruits, vegetables, grains, nuts, and beans. °Taking a fiber supplement if you are not able to take in enough fiber in your diet. It is better to get fiber through food than from a supplement. °Gradually increase how much fiber you consume. If you increase your intake of dietary fiber too quickly, you may have bloating, cramping, or gas. °Drink plenty of water to help you digest fiber. °Choose high-fiber snacks, such as berries, raw vegetables, nuts, and popcorn. °What foods should I eat? °Fruits °Berries. Pears. Apples. Oranges. Avocado. Prunes and raisins. Dried figs. °Vegetables °Sweet potatoes. Spinach. Kale. Artichokes. Cabbage. Broccoli. Cauliflower. Green peas. Carrots. Squash. °Grains °Whole-grain breads. Multigrain cereal. Oats and oatmeal. Brown rice. Barley. Bulgur wheat. Millet. Quinoa. Bran muffins. Popcorn. Rye wafer crackers. °Meats and other proteins °Navy beans, kidney beans, and pinto beans. Soybeans. Split peas. Lentils. Nuts and seeds. °Dairy °Fiber-fortified yogurt. °Beverages °Fiber-fortified soy milk. Fiber-fortified orange juice. °Other foods °Fiber bars. °The items listed above may not be a complete list of recommended foods and beverages. Contact a dietitian for more information. °What foods should I avoid? °Fruits °Fruit juice. Cooked, strained fruit. °Vegetables °Fried potatoes. Canned vegetables. Well-cooked vegetables. °Grains °White bread. Pasta made with refined flour. White rice. °Meats and other proteins °Fatty cuts of meat. Fried chicken or fried fish. °Dairy °Milk. Yogurt. Cream cheese. Sour cream. °Fats and   oils °Butters. °Beverages °Soft drinks. °Other foods °Cakes and pastries. °The items listed above may  not be a complete list of foods and beverages to avoid. Talk with your dietitian about what choices are best for you. °Summary °Fiber is a type of carbohydrate. It is found in foods such as fruits, vegetables, whole grains, and beans. °A high-fiber diet has many benefits. It can help to prevent constipation, lower blood cholesterol, aid weight loss, and reduce your risk of heart disease, diabetes, and certain cancers. °Increase your intake of fiber gradually. Increasing fiber too quickly may cause cramping, bloating, and gas. Drink plenty of water while you increase the amount of fiber you consume. °The best sources of fiber include whole fruits and vegetables, whole grains, nuts, seeds, and beans. °This information is not intended to replace advice given to you by your health care provider. Make sure you discuss any questions you have with your health care provider. °Document Revised: 09/17/2019 Document Reviewed: 09/17/2019 °Elsevier Patient Education © 2022 Elsevier Inc. ° °

## 2021-08-14 NOTE — Addendum Note (Signed)
Addended by: Eliseo Squires on: 08/14/2021 04:09 PM ? ? Modules accepted: Orders ? ?

## 2021-08-14 NOTE — Progress Notes (Signed)
?  ?Jonathon Bellows MD, MRCP(U.K) ?[address]  ?[address]  ?[place], [place] [address]  ?Main: [phone]  ?Fax: [phone] ? ? ?Gastroenterology Consultation ? ?Referring Provider:     Renee Rival, NP ?Primary Care Physician:  Pcp, No ?Primary Gastroenterologist:  Dr. Jonathon Bellows  ?Reason for Consultation:     IBS ?      ? HPI:   ?Morgan David is a 61 y.o. y/o female referred for irritable bowel syndrome and colon cancer screening.  She states that she has had irritable bowel syndrome with predominant diarrheal symptoms since she was a teenager.  Associated with increased stress.  Describes urgency and watery bowel movements after her meals.  Recently her husband passed and the symptoms are slightly worse.  She does complain of lower abdominal discomfort preceded by bowel movement and relieved after.  She consumes about 4 glasses of diet [facility] on a daily basis.  And black coffee with Stevia a few times a day.  No weight loss no rectal bleeding.  Last colonoscopy was over 5 years back.  She was found to have colon polyps unknown type but was told to return in 5 years but due to issues at home did not return back sooner.  Now wishes to get a colonoscopy.  No family history of colon cancer or polyps. ? ?Does not recall being tested for celiac disease in the past.  No unintentional weight loss. ?Past Medical History:  ?Diagnosis Date  ? 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 right prox lat elbow  ? Dysplastic nevus 04/18/2010  ? left LQA 8.0 cm from umbilicus, moderate to severe atypia  ? Dysplastic nevus 04/18/2010  ? R chest parasternal, moderate to severe atypia  ? ? ?History reviewed. No pertinent surgical history. ? ?Prior to Admission medications   ?Medication Sig Start Date End Date Taking? Authorizing Provider  ?atorvastatin (LIPITOR) 80 MG tablet Take 1 tablet by mouth daily.   Yes [provider]  ?cetirizine (ZYRTEC) 10 MG tablet Take by mouth.   Yes [provider]   ?clonazePAM (KLONOPIN) 1 MG tablet Take 1 tablet by mouth daily as needed. 11/22/20  Yes [provider]  ?EPINEPHrine 0.3 mg/0.3 mL IJ SOAJ injection epinephrine 0.3 mg/0.3 mL injection, auto-injector   Yes [provider]  ?fenofibrate (TRICOR) 48 MG tablet Take 1 tablet by mouth 1 day or 1 dose. 12/17/18  Yes [provider]  ?levothyroxine (SYNTHROID) 112 MCG tablet levothyroxine 112 mcg tablet   Yes [provider]  ?montelukast (SINGULAIR) 10 MG tablet montelukast 10 mg tablet   Yes [provider]  ?Na Sulfate-K Sulfate-Mg Sulf 17.5-3.13-1.6 GM/177ML SOLN Take 1 kit by mouth once for 1 dose. 08/14/21 08/14/21 Yes Jonathon Bellows, MD  ?Omega-3 Fatty Acids (FISH OIL) 1000 MG CAPS Take 1 capsule by mouth daily.   Yes [provider]  ?omeprazole (PRILOSEC) 40 MG capsule Take 40 mg by mouth daily. 07/19/21  Yes [provider]  ?Tavaborole (KERYDIN) 5 % SOLN Apply 1 application topically at bedtime. Paint toenails qhs 05/31/21  Yes Ralene Bathe, MD  ?terbinafine (LAMISIL) 250 MG tablet Take 1 tablet (250 mg total) by mouth daily. 05/17/21  Yes Ralene Bathe, MD  ?Triamcinolone Acetonide (NASACORT AQ NA) Place into the nose.   Yes [provider]  ?valACYclovir (VALTREX) 1000 MG tablet Take 1 tablet (1,000 mg total) by mouth as directed. 2 po at onset of fever blisters and 2 po 12 hours  later 05/17/21  Yes Ralene Bathe, MD  ? ? ?History reviewed. No pertinent family history.  ? ?Social History  ? ?Tobacco Use  ? Smoking status: Never  ? Smokeless tobacco: Never  ?Substance Use Topics  ? Alcohol use: Yes  ?  Comment: occs.  ? Drug use: Never  ? ? ?Allergies as of 08/14/2021 - Review Complete 08/14/2021  ?Allergen Reaction Noted  ? Codeine Nausea Only and Other (See Comments) 11/11/2008  ? Morphine  01/19/2019  ? ? ?Review of Systems:    ?All systems reviewed and negative except where noted in HPI. ? ? Physical Exam:  ?BP 137/77 (BP  Location: Left Arm, Patient Position: Sitting, Cuff Size: Normal)   Pulse 69   Temp 97.8 ?F (36.6 ?C) (Oral)   Ht '5\' 5"'  (1.651 m)   Wt 164 lb 3.2 oz (74.5 kg)   BMI 27.32 kg/m?  ?No LMP recorded. ?Psych:  Alert and cooperative. Normal mood and affect. ?General:   Alert,  Well-developed, well-nourished, pleasant and cooperative in NAD ?Head:  Normocephalic and atraumatic. ?Eyes:  Sclera clear, no icterus.   Conjunctiva pink. ?Ears:  Normal auditory acuity.  ?Extremities:  No clubbing or edema.  No cyanosis. ?Psych:  Alert and cooperative. Normal mood and affect. ? ?Imaging Studies: ?No results found. ? ?Assessment and Plan:  ? ?Morgan David is a 61 y.o. y/o female has been referred for irritable bowel syndrome with diarrhea for many years slightly worse recently.  Very likely this is because of consumption of large quantity of foods with high fructose corn syrup and artificial sugars such as diet Mountain Dew and Stevia.  This can cause an osmotic diarrhea and exasperate underlying irritable bowel syndrome.  She is also due for a colonoscopy with personal history of colon polyps. ? ?Plan ?1.  High-fiber diet counseled about 25 g of fiber per day.  Patient information provided. ?2.  Avoid all artificial sugars and sweeteners. ?3.  Screening colonoscopy ?4.  Check celiac serology ?5.  If no better advised her to call our office and we can see her for irritable bowel syndrome and consider treating her with a course of Xifaxan. ? ? ?I have discussed alternative options, risks & benefits,  which include, but are not limited to, bleeding, infection, perforation,respiratory complication & drug reaction.  The patient agrees with this plan & written consent will be obtained.   ? ? ?Follow up in as needed ? ?Dr Jonathon Bellows MD,MRCP(U.K) ? ?

## 2021-08-16 LAB — CELIAC DISEASE PANEL
Endomysial IgA: NEGATIVE
IgA/Immunoglobulin A, Serum: 42 mg/dL — ABNORMAL LOW (ref 87–352)
Transglutaminase IgA: <2 U/mL (ref 0–3)

## 2021-08-16 LAB — TISSUE TRANSGLUTAMINASE, IGG: Tissue Transglut Ab: <2 U/mL (ref 0–5)

## 2021-08-31 ENCOUNTER — Ambulatory Visit: Payer: BC Managed Care – PPO | Admitting: Anesthesiology

## 2021-08-31 ENCOUNTER — Ambulatory Visit
Admission: RE | Admit: 2021-08-31 | Discharge: 2021-08-31 | Disposition: A | Discharge status: Home / Self Care | Payer: BC Managed Care – PPO | Attending: Gastroenterology | Admitting: Gastroenterology

## 2021-08-31 ENCOUNTER — Encounter: Payer: Self-pay | Admitting: Gastroenterology

## 2021-08-31 ENCOUNTER — Encounter
Admission: RE | Disposition: A | Discharge status: Home / Self Care | Payer: Self-pay | Source: Home / Self Care | Attending: Gastroenterology

## 2021-08-31 DIAGNOSIS — E669 Obesity, unspecified: Secondary | ICD-10-CM | POA: Diagnosis not present

## 2021-08-31 DIAGNOSIS — Z1211 Encounter for screening for malignant neoplasm of colon: Secondary | ICD-10-CM | POA: Diagnosis not present

## 2021-08-31 DIAGNOSIS — K589 Irritable bowel syndrome without diarrhea: Secondary | ICD-10-CM

## 2021-08-31 DIAGNOSIS — K219 Gastro-esophageal reflux disease without esophagitis: Secondary | ICD-10-CM | POA: Insufficient documentation

## 2021-08-31 DIAGNOSIS — E039 Hypothyroidism, unspecified: Secondary | ICD-10-CM | POA: Diagnosis not present

## 2021-08-31 DIAGNOSIS — Z6826 Body mass index (BMI) 26.0-26.9, adult: Secondary | ICD-10-CM | POA: Insufficient documentation

## 2021-08-31 HISTORY — DX: Irritable bowel syndrome, unspecified: K58.9

## 2021-08-31 HISTORY — PX: COLONOSCOPY WITH PROPOFOL: SHX5780

## 2021-08-31 SURGERY — COLONOSCOPY WITH PROPOFOL
Anesthesia: General

## 2021-08-31 MED ORDER — STERILE WATER FOR IRRIGATION IR SOLN
Status: DC | PRN
  Administered 2021-08-31: 60 mL

## 2021-08-31 MED ORDER — PROPOFOL 500 MG/50ML IV EMUL
INTRAVENOUS | Status: DC | PRN
  Administered 2021-08-31: 140 ug/kg/min via INTRAVENOUS

## 2021-08-31 MED ORDER — PROPOFOL 10 MG/ML IV BOLUS
INTRAVENOUS | Status: DC | PRN
  Administered 2021-08-31: 30 mg via INTRAVENOUS
  Administered 2021-08-31: 20 mg via INTRAVENOUS
  Administered 2021-08-31: 60 mg via INTRAVENOUS
  Administered 2021-08-31: 20 mg via INTRAVENOUS

## 2021-08-31 MED ORDER — LIDOCAINE HCL (CARDIAC) PF 100 MG/5ML IV SOSY
PREFILLED_SYRINGE | INTRAVENOUS | Status: DC | PRN
  Administered 2021-08-31: 50 mg via INTRAVENOUS

## 2021-08-31 MED ORDER — SODIUM CHLORIDE 0.9 % IV SOLN: INTRAVENOUS | Status: DC

## 2021-08-31 MED ORDER — PROPOFOL 500 MG/50ML IV EMUL
INTRAVENOUS | Status: AC
  Filled 2021-08-31: qty 50

## 2021-08-31 NOTE — Anesthesia Procedure Notes (Signed)
Date/Time: 08/31/2021 10:20 AM ?Performed by: Lily Peer, Carry Ortez, CRNA ?Pre-anesthesia Checklist: Patient identified, Emergency Drugs available, Suction available, Patient being monitored and Timeout performed ?Patient Re-evaluated:Patient Re-evaluated prior to induction ?Oxygen Delivery Method: Nasal cannula ?Induction Type: IV induction ? ? ? ? ?

## 2021-08-31 NOTE — H&P (Signed)
? ? ? ?Morgan Bellows, MD ?[address] [place], [place], [place], [address] ?[address] [place], [place], [place], [address] ?Phone: [phone]  ?Fax: [phone] ? ?Primary Care Physician:  [facility] ? ? ?Pre-Procedure History & Physical: ?HPI:  Morgan David is a 61 y.o. female is here for an colonoscopy. ?  ?Past Medical History:  ?Diagnosis Date  ? 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 right prox lat elbow  ? Dysplastic nevus 04/18/2010  ? left LQA 8.0 cm from umbilicus, moderate to severe atypia  ? Dysplastic nevus 04/18/2010  ? R chest parasternal, moderate to severe atypia  ? Irritable bowel   ? ? ?History reviewed. No pertinent surgical history. ? ?Prior to Admission medications   ?Medication Sig Start Date End Date Taking? Authorizing Provider  ?cetirizine (ZYRTEC) 10 MG tablet Take by mouth.   Yes [provider]  ?clonazePAM (KLONOPIN) 1 MG tablet Take 1 tablet by mouth daily as needed. 11/22/20  Yes [provider]  ?atorvastatin (LIPITOR) 80 MG tablet Take 1 tablet by mouth daily.    [provider]  ?EPINEPHrine 0.3 mg/0.3 mL IJ SOAJ injection epinephrine 0.3 mg/0.3 mL injection, auto-injector    [provider]  ?fenofibrate (TRICOR) 48 MG tablet Take 1 tablet by mouth 1 day or 1 dose. ?Patient not taking: Reported on 08/31/2021 12/17/18   [provider]  ?levothyroxine (SYNTHROID) 112 MCG tablet levothyroxine 112 mcg tablet    [provider]  ?montelukast (SINGULAIR) 10 MG tablet montelukast 10 mg tablet    [provider]  ?Omega-3 Fatty Acids (FISH OIL) 1000 MG CAPS Take 1 capsule by mouth daily.    [provider]  ?omeprazole (PRILOSEC) 40 MG capsule Take 40 mg by mouth daily. 07/19/21   [provider]  ?Tavaborole (KERYDIN) 5 % SOLN Apply 1 application topically at bedtime. Paint toenails qhs 05/31/21   Ralene Bathe, MD  ?terbinafine (LAMISIL) 250 MG tablet Take 1 tablet (250 mg  total) by mouth daily. ?Patient not taking: Reported on 08/31/2021 05/17/21   Ralene Bathe, MD  ?Triamcinolone Acetonide (NASACORT AQ NA) Place into the nose. ?Patient not taking: Reported on 08/31/2021    [provider]  ?valACYclovir (VALTREX) 1000 MG tablet Take 1 tablet (1,000 mg total) by mouth as directed. 2 po at onset of fever blisters and 2 po 12 hours later ?Patient not taking: Reported on 08/31/2021 05/17/21   Ralene Bathe, MD  ? ? ?Allergies as of 08/15/2021 - Review Complete 08/14/2021  ?Allergen Reaction Noted  ? Codeine Nausea Only and Other (See Comments) 11/11/2008  ? Morphine  01/19/2019  ? ? ?History reviewed. No pertinent family history. ? ?Social History  ? ?Socioeconomic History  ? Marital status: Widowed  ?  Spouse name: Not on file  ? Number of children: Not on file  ? Years of education: Not on file  ? Highest education level: Not on file  ?Occupational History  ? Not on file  ?Tobacco Use  ? Smoking status: Never  ? Smokeless tobacco: Never  ?Vaping Use  ? Vaping Use: Never used  ?Substance and Sexual Activity  ? Alcohol use: Not Currently  ? Drug use: Never  ? Sexual activity: Not on file  ?Other Topics Concern  ? Not on file  ?Social History Narrative  ? Not on file  ? ?Social Determinants of Health  ? ?Financial Resource Strain: Not on file  ?Food Insecurity: Not on  file  ?Transportation Needs: Not on file  ?Physical Activity: Not on file  ?Stress: Not on file  ?Social Connections: Not on file  ?Intimate Partner Violence: Not on file  ? ? ?Review of Systems: ?See HPI, otherwise negative ROS ? ?Physical Exam: ?BP (!) 138/105   Pulse 95   Temp (!) 96.8 ?F (36 ?C) (Temporal)   Resp 16   Ht '5\' 5"'$  (1.651 m)   Wt 71.2 kg   SpO2 100%   BMI 26.13 kg/m?  ?General:   Alert,  pleasant and cooperative in NAD ?Head:  Normocephalic and atraumatic. ?Neck:  Supple; no masses or thyromegaly. ?Lungs:  Clear throughout to auscultation, normal respiratory effort.    ?Heart:  +S1, +S2,  Regular rate and rhythm, No edema. ?Abdomen:  Soft, nontender and nondistended. Normal bowel sounds, without guarding, and without rebound.   ?Neurologic:  Alert and  oriented x4;  grossly normal neurologically. ? ?Impression/Plan: ?Morgan David is here for an colonoscopy to be performed for Screening colonoscopy average risk   ?Risks, benefits, limitations, and alternatives regarding  colonoscopy have been reviewed with the patient.  Questions have been answered.  All parties agreeable. ? ? ?Morgan Bellows, MD  08/31/2021, 10:17 AM ? ?

## 2021-08-31 NOTE — Anesthesia Preprocedure Evaluation (Addendum)
Anesthesia Evaluation  ?Patient identified by MRN, date of birth, ID band ?Patient awake ? ? ? ?Reviewed: ?Allergy & Precautions, NPO status , Patient's Chart, lab work & pertinent test results ? ?Airway ?Mallampati: III ? ?TM Distance: >3 FB ?Neck ROM: full ? ? ? Dental ?no notable dental hx. ? ?  ?Pulmonary ?neg pulmonary ROS,  ?  ?Pulmonary exam normal ? ? ? ? ? ? ? Cardiovascular ?negative cardio ROS ?Normal cardiovascular exam ? ? ?  ?Neuro/Psych ?negative neurological ROS ? negative psych ROS  ? GI/Hepatic ?Neg liver ROS, GERD  Controlled,  ?Endo/Other  ?Hypothyroidism  ? Renal/GU ?negative Renal ROS  ?negative genitourinary ?  ?Musculoskeletal ? ? Abdominal ?(+) + obese,   ?Peds ? Hematology ?negative hematology ROS ?(+)   ?Anesthesia Other Findings ?Past Medical History: ?01/21/2013: Atypical nevus ?    Comment:  right prox lat elbow ?04/18/2010: Dysplastic nevus ?    Comment:  left LQA 8.0 cm from umbilicus, moderate to severe  ?             atypia ?04/18/2010: Dysplastic nevus ?    Comment:  R chest parasternal, moderate to severe atypia ? ?No past surgical history on file. ? ? ? ? Reproductive/Obstetrics ?negative OB ROS ? ?  ? ? ? ? ? ? ? ? ? ? ? ? ? ?  ?  ? ? ? ? ? ? ? ?Anesthesia Physical ?Anesthesia Plan ? ?ASA: 2 ? ?Anesthesia Plan: General  ? ?Post-op Pain Management:   ? ?Induction: Intravenous ? ?PONV Risk Score and Plan: Propofol infusion and TIVA ? ?Airway Management Planned:  ? ?Additional Equipment:  ? ?Intra-op Plan:  ? ?Post-operative Plan:  ? ?Informed Consent: I have reviewed the patients History and Physical, chart, labs and discussed the procedure including the risks, benefits and alternatives for the proposed anesthesia with the patient or authorized representative who has indicated his/her understanding and acceptance.  ? ? ? ?Dental advisory given ? ?Plan Discussed with: Anesthesiologist, CRNA and Surgeon ? ?Anesthesia Plan Comments:    ? ? ? ? ? ?Anesthesia Quick Evaluation ? ?

## 2021-08-31 NOTE — Op Note (Signed)
[facility] ?Gastroenterology ?Patient Name: Morgan David ?Procedure Date: 08/31/2021 10:19 AM ?MRN: [id number] ?Account #: [id number] ?Date of Birth: 1960-07-31 ?Admit Type: Outpatient ?Age: 61 ?Room: [facility] ENDO ROOM 3 ?Gender: Female ?Note Status: Finalized ?Instrument Name: Colonoscope [id number] ?Procedure:             Colonoscopy ?Indications:           Screening for colorectal malignant neoplasm ?Providers:             Jonathon Bellows MD, MD ?Referring MD:          Forest Gleason Md, MD (Referring MD) ?Medicines:             Monitored Anesthesia Care ?Complications:         No immediate complications. ?Procedure:             Pre-Anesthesia Assessment: ?                       - Prior to the procedure, a History and Physical was  ?                       performed, and patient medications, allergies and  ?                       sensitivities were reviewed. The patient's tolerance  ?                       of previous anesthesia was reviewed. ?                       - The risks and benefits of the procedure and the  ?                       sedation options and risks were discussed with the  ?                       patient. All questions were answered and informed  ?                       consent was obtained. ?                       - ASA Grade Assessment: II - A patient with mild  ?                       systemic disease. ?                       After obtaining informed consent, the colonoscope was  ?                       passed under direct vision. Throughout the procedure,  ?                       the patient's blood pressure, pulse, and oxygen  ?                       saturations were monitored continuously. The  ?                       Colonoscope was  introduced through the anus and  ?                       advanced to the the cecum, identified by the  ?                       appendiceal orifice. The colonoscopy was performed  ?                       with ease. The patient tolerated the procedure well.  ?                        The quality of the bowel preparation was excellent. ?Findings: ?     The perianal and digital rectal examinations were normal. ?     The entire examined colon appeared normal on direct and retroflexion  ?     views. ?Impression:            - The entire examined colon is normal on direct and  ?                       retroflexion views. ?                       - No specimens collected. ?Recommendation:        - Discharge patient to home (with escort). ?                       - Resume previous diet. ?                       - Continue present medications. ?                       - Repeat colonoscopy in 10 years for screening  ?                       purposes. ?Procedure Code(s):     --- Professional --- ?                       [phone], Colonoscopy, flexible; diagnostic, including  ?                       collection of specimen(s) by brushing or washing, when  ?                       performed (separate procedure) ?Diagnosis Code(s):     --- Professional --- ?                       Z12.11, Encounter for screening for malignant neoplasm  ?                       of colon ?CPT copyright 2019 [employer]. All rights reserved. ?The codes documented in this report are preliminary and upon coder review may  ?be revised to meet current compliance requirements. ?Jonathon Bellows, MD ?Jonathon Bellows MD, MD ?08/31/2021 10:38:25 AM ?This report has been signed electronically. ?Number of Addenda: 0 ?Note Initiated On: 08/31/2021 10:19 AM ?Scope Withdrawal Time: 0 hours 7 minutes 21 seconds  ?Total Procedure Duration: 0 hours  10 minutes 41 seconds  ?Estimated Blood Loss:  Estimated blood loss: none. ?     [facility] ?

## 2021-08-31 NOTE — Transfer of Care (Signed)
Immediate Anesthesia Transfer of Care Note ? ?Patient: Morgan David ? ?Procedure(s) Performed: COLONOSCOPY WITH PROPOFOL ? ?Patient Location: Endoscopy Unit ? ?Anesthesia Type:General ? ?Level of Consciousness: drowsy ? ?Airway & Oxygen Therapy: Patient Spontanous Breathing ? ?Post-op Assessment: Report given to RN and Post -op Vital signs reviewed and stable ? ?Post vital signs: Reviewed and stable ? ?Last Vitals:  ?Vitals Value Taken Time  ?BP 99/37 08/31/21 1038  ?Temp    ?Pulse 82 08/31/21 1038  ?Resp 21 08/31/21 1038  ?SpO2 96 % 08/31/21 1038  ?Vitals shown include unvalidated device data. ? ?Last Pain:  ?Vitals:  ? 08/31/21 0936  ?TempSrc: Temporal  ?   ? ?  ? ?Complications: No notable events documented. ?

## 2021-09-02 NOTE — Anesthesia Postprocedure Evaluation (Signed)
Anesthesia Post Note ? ?Patient: Morgan David ? ?Procedure(s) Performed: COLONOSCOPY WITH PROPOFOL ? ?Patient location during evaluation: Endoscopy ?Anesthesia Type: General ?Level of consciousness: awake and alert ?Pain management: pain level controlled ?Vital Signs Assessment: post-procedure vital signs reviewed and stable ?Respiratory status: spontaneous breathing, nonlabored ventilation and respiratory function stable ?Cardiovascular status: blood pressure returned to baseline and stable ?Postop Assessment: no apparent nausea or vomiting ?Anesthetic complications: no ? ? ?No notable events documented. ? ? ?Last Vitals:  ?Vitals:  ? 08/31/21 1100 08/31/21 1110  ?BP: 129/66 135/69  ?Pulse: 72 66  ?Resp: 19 12  ?Temp:    ?SpO2: 100% 100%  ?  ?Last Pain:  ?Vitals:  ? 08/31/21 0936  ?TempSrc: Temporal  ? ? ?  ?  ?  ?  ?  ?  ? ?[place] Ouch ? ? ? ? ?

## 2021-09-04 ENCOUNTER — Encounter: Payer: Self-pay | Admitting: Gastroenterology

## 2022-05-14 IMAGING — MG MM DIGITAL DIAGNOSTIC UNILAT*L* W/ TOMO W/ CAD
4 series · 4 of 12 positions shown · non-contrast
Comparison: Previous exam(s).

CLINICAL DATA: Patient returns after screening study for evaluation
of possible LEFT breast mass.

EXAM:
DIGITAL DIAGNOSTIC LEFT MAMMOGRAM WITH CAD AND TOMO
ULTRASOUND LEFT BREAST

[L CC synth-2D]
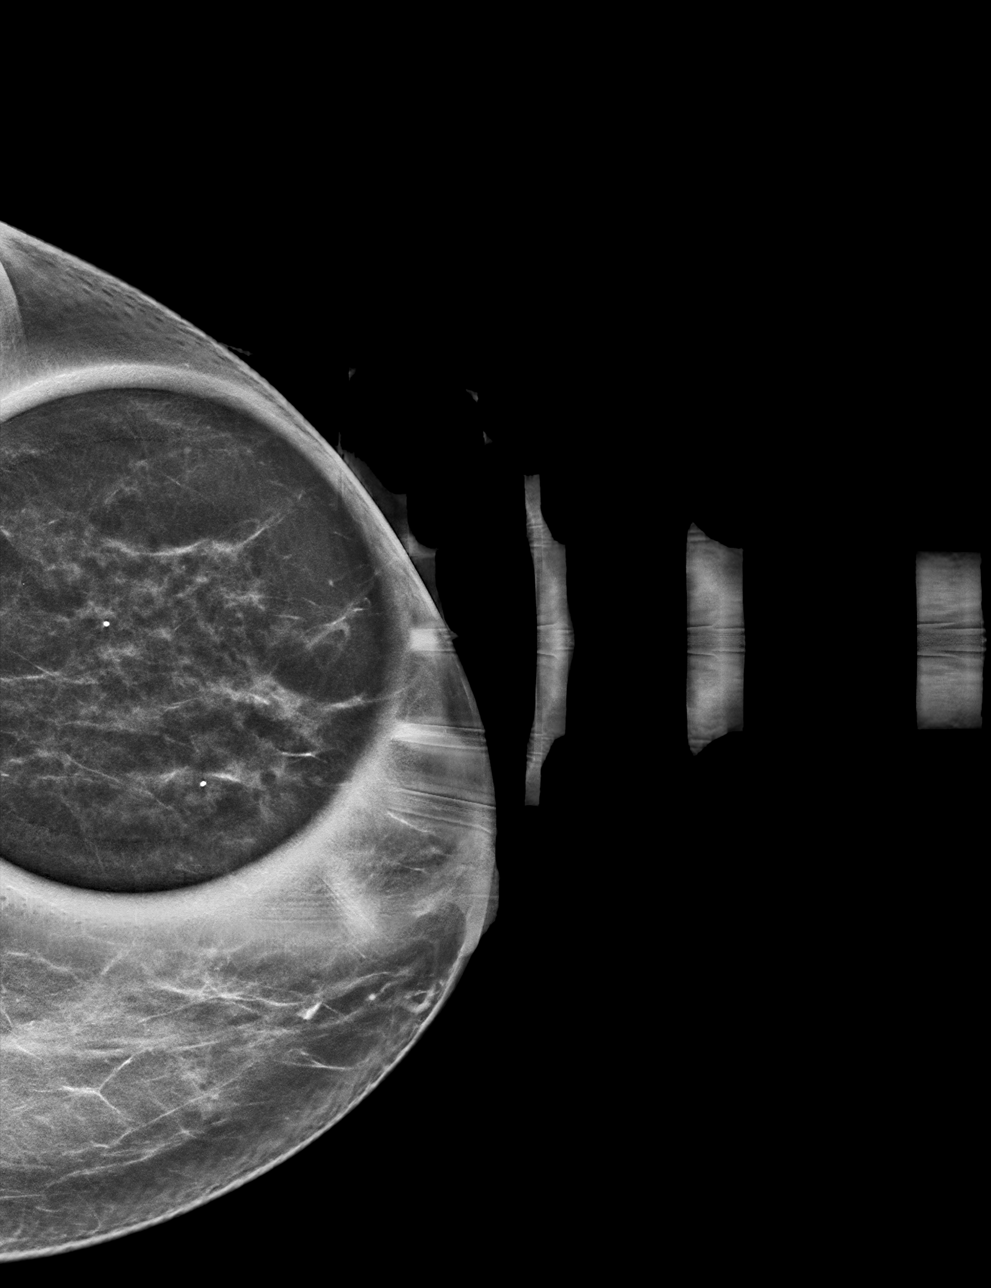

[L MLO synth-2D]
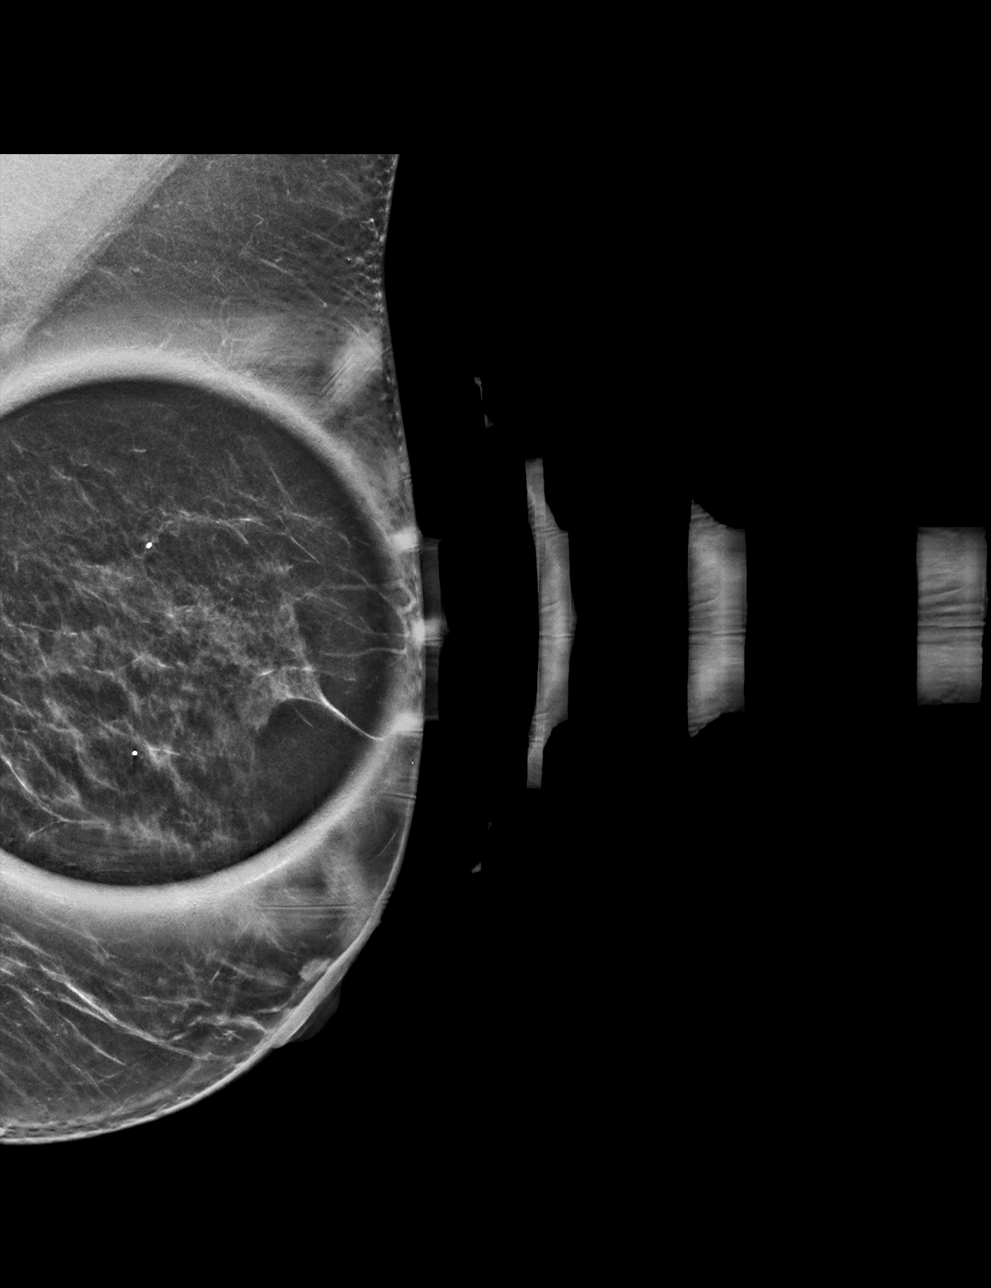

[L MLO tomo · tomo slice 30/59.0]
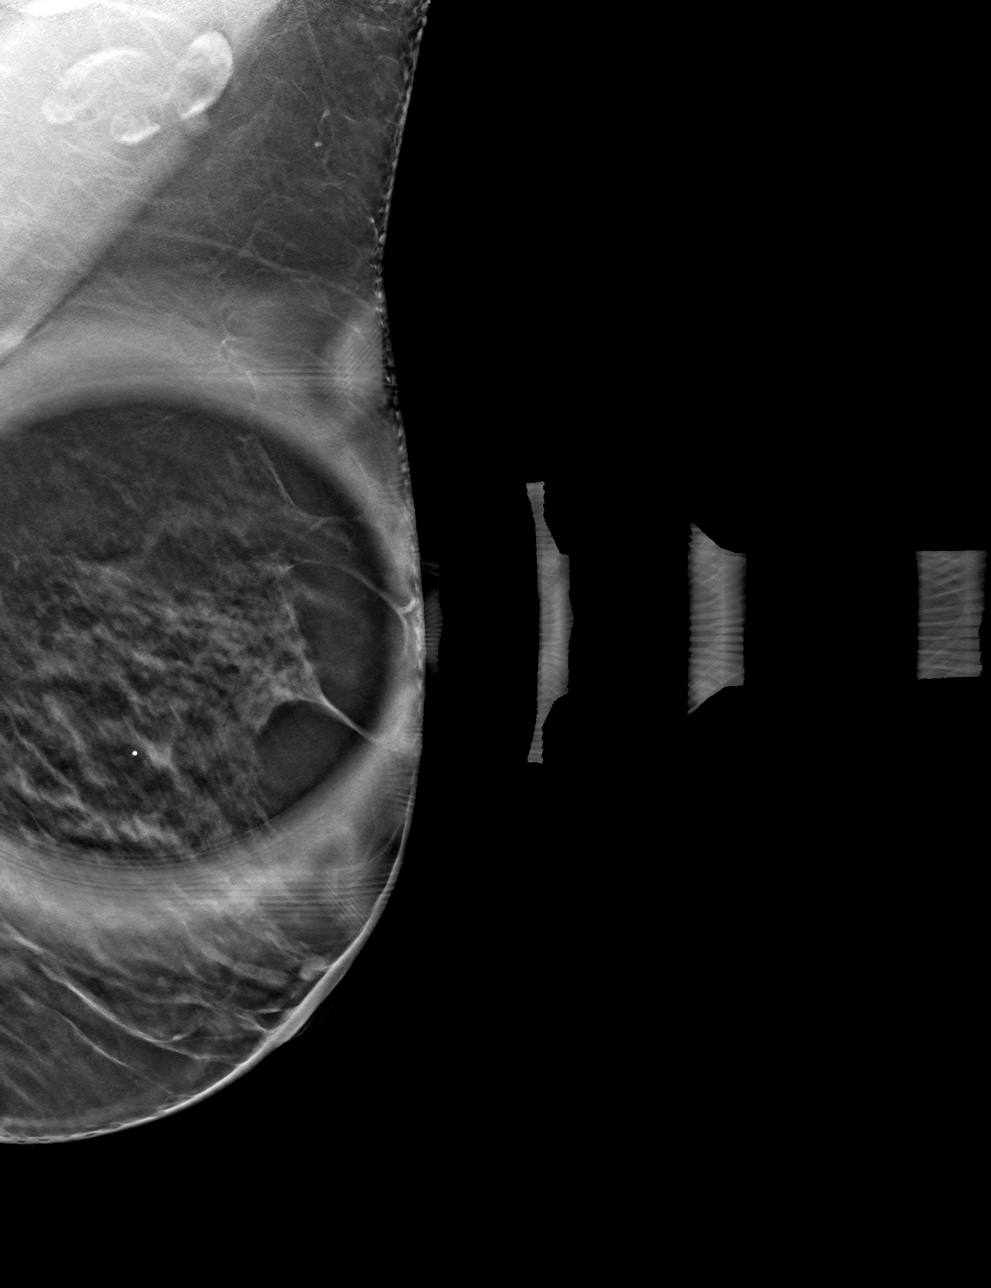

[L CC tomo · tomo slice 31/60.0]
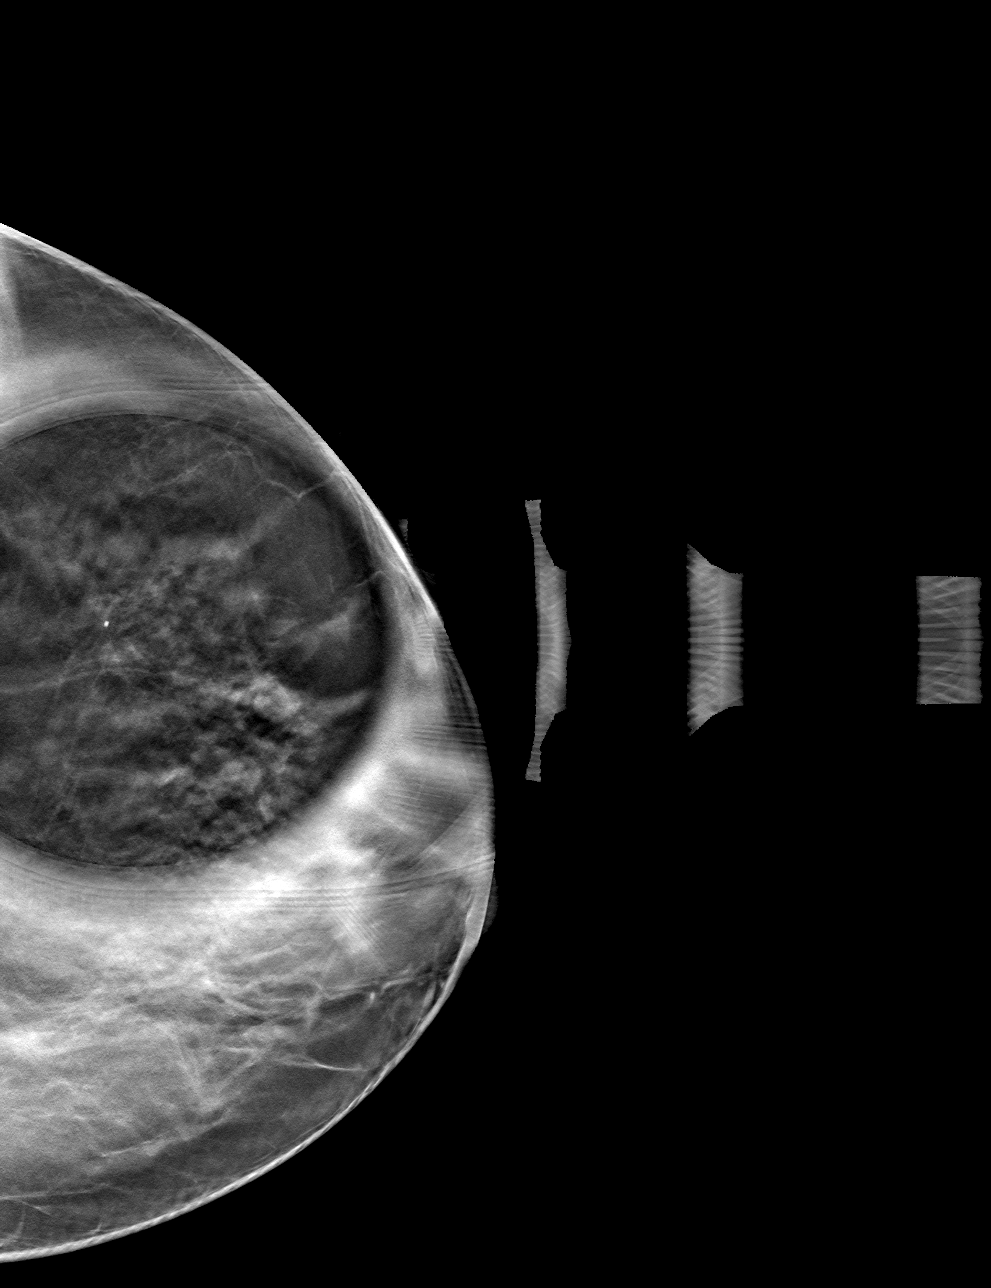

[4 of 12 positions shown; findings below may reference images not displayed]

ACR Breast Density Category c: The breast tissue is heterogeneously
dense, which may obscure small masses.
FINDINGS: Additional 2-D and 3-D images are performed. These views confirm
presence of a circumscribed oval mass in the LATERAL portion of the
LEFT breast.

Mammographic images were processed with CAD.

Targeted ultrasound is performed, showing a simple cyst in the 2
o'clock location of the LEFT breast 3 centimeters from the nipple
which measures 0.6 x 0.3 x 0.6 centimeters. There is no associated
internal blood flow or solid component.
IMPRESSION: Benign cyst in the LEFT breast. No mammographic or ultrasound
evidence for malignancy.

RECOMMENDATION:
Screening mammogram in one year.(Code:7E-V-OAY)

I have discussed the findings and recommendations with the patient.
If applicable, a reminder letter will be sent to the patient
regarding the next appointment.

BI-RADS CATEGORY  2: Benign.

## 2022-05-14 IMAGING — US US BREAST*L* LIMITED INC AXILLA
1 series · 5 of 5 positions shown · non-contrast
Comparison: Previous exam(s).

CLINICAL DATA: Patient returns after screening study for evaluation
of possible LEFT breast mass.

EXAM:
DIGITAL DIAGNOSTIC LEFT MAMMOGRAM WITH CAD AND TOMO
ULTRASOUND LEFT BREAST

[Series 1: us breast*left* limited inc axilla · 0.07mm/px · 5 of 5 slices shown]
[im 1/5]
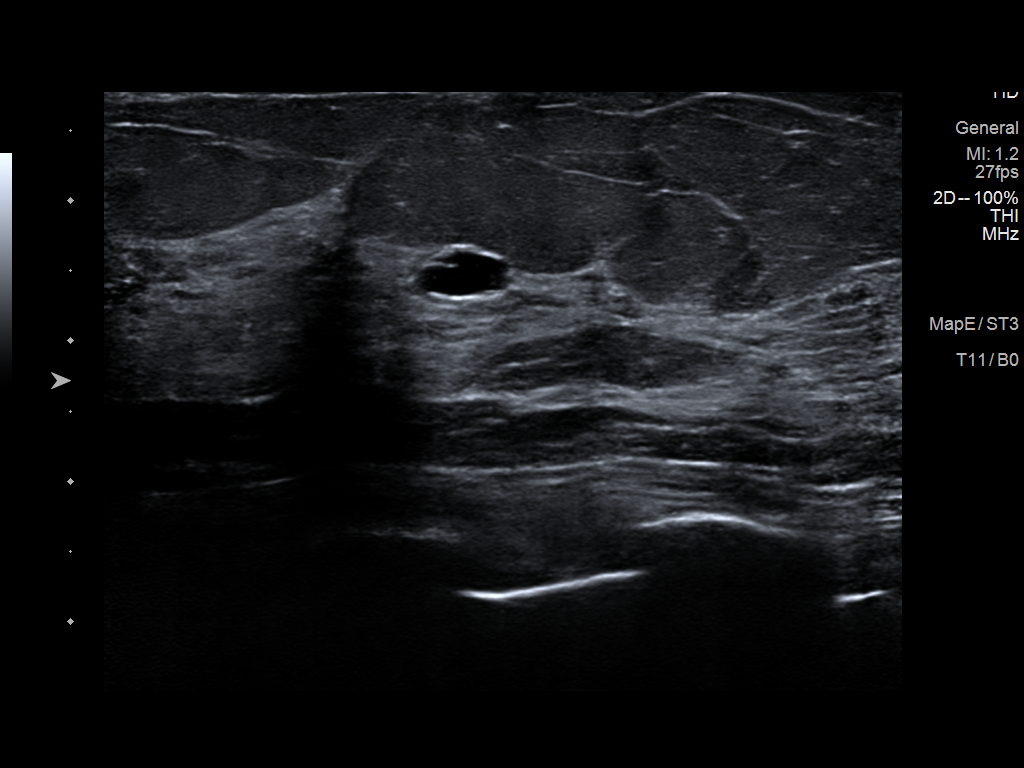
[im 2/5]
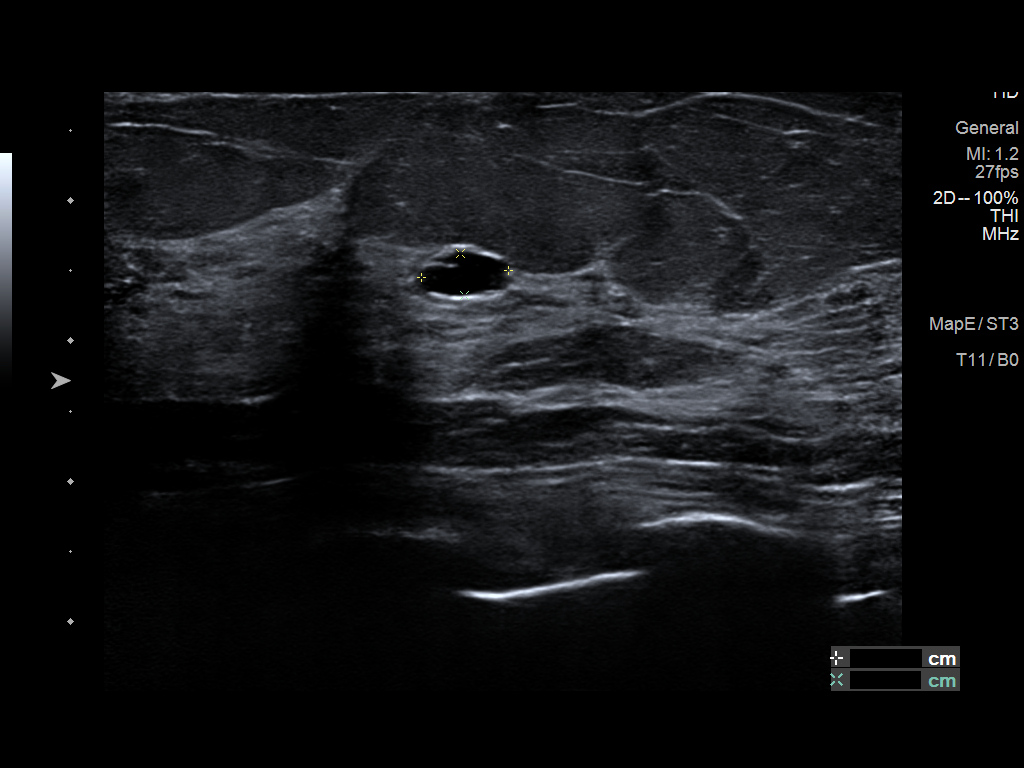
[im 3/5]
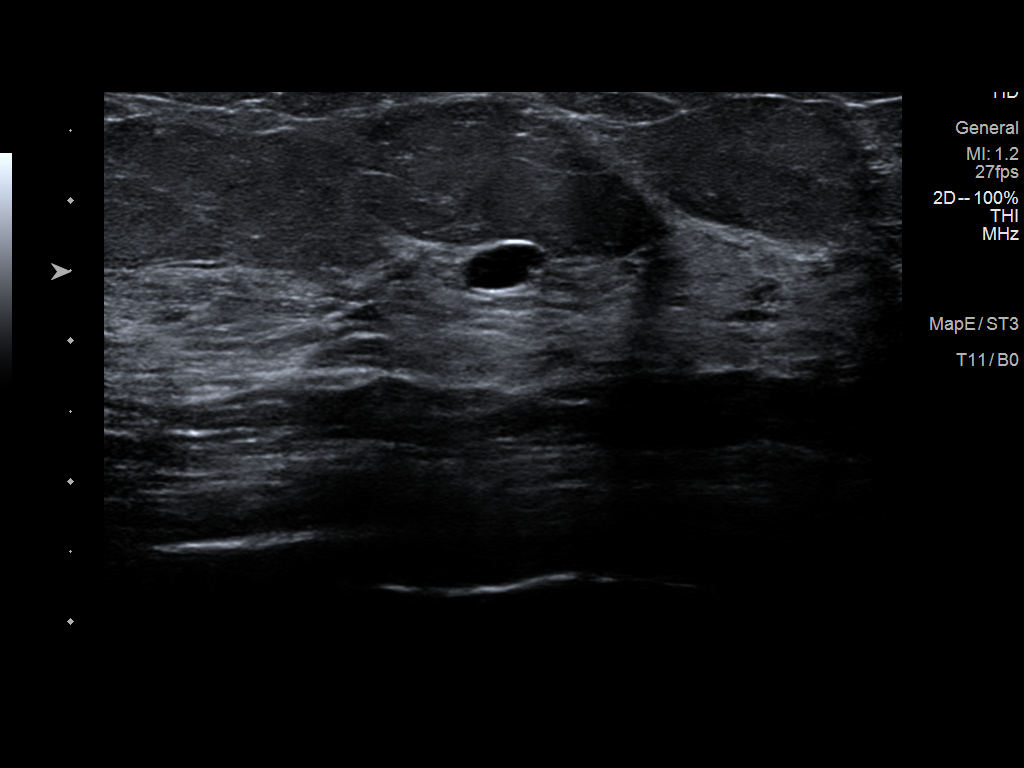
[im 4/5]
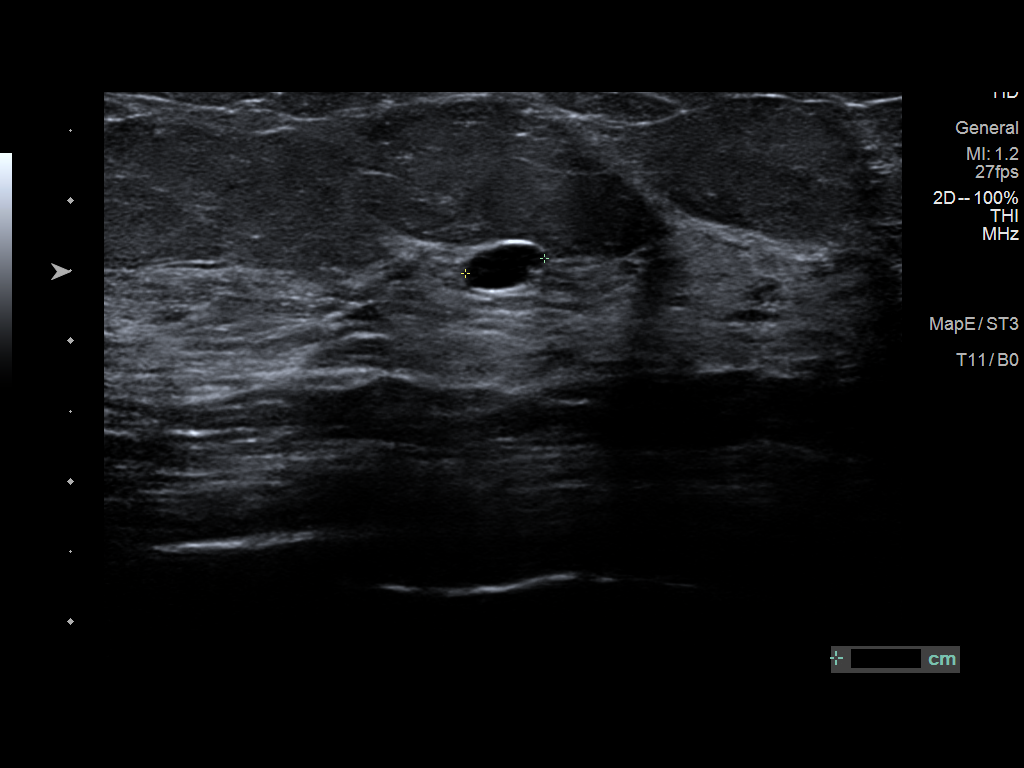
[im 5/5]
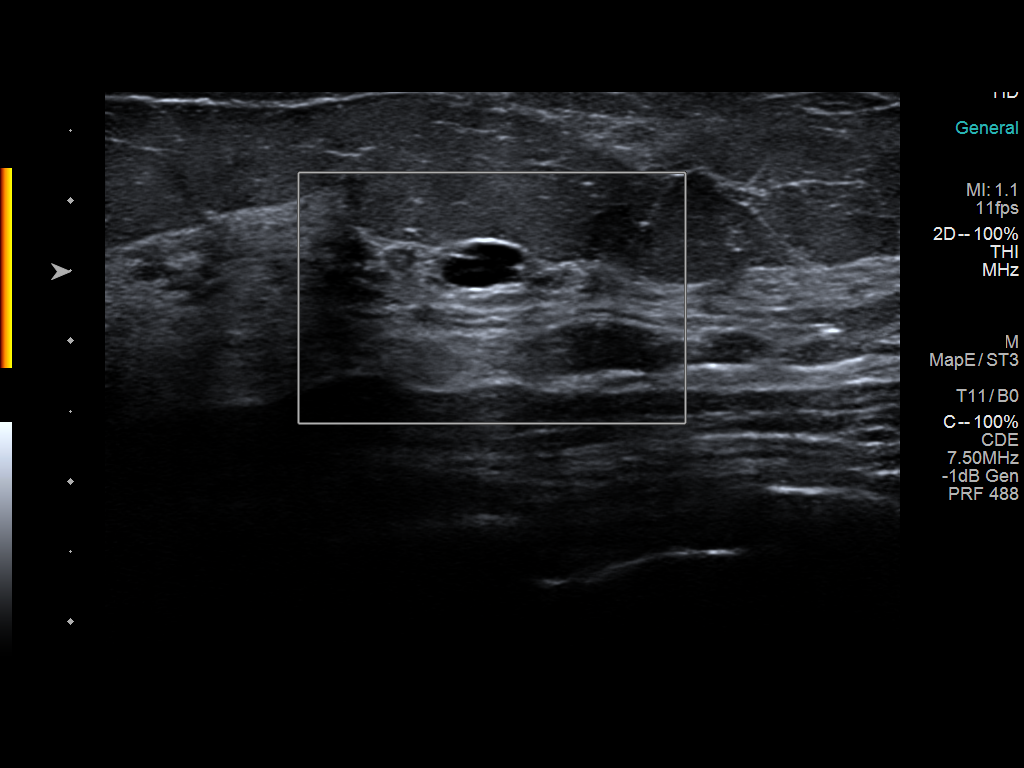

[5 of 5 positions shown; findings below may reference images not displayed]

ACR Breast Density Category c: The breast tissue is heterogeneously
dense, which may obscure small masses.
FINDINGS: Additional 2-D and 3-D images are performed. These views confirm
presence of a circumscribed oval mass in the LATERAL portion of the
LEFT breast.

Mammographic images were processed with CAD.

Targeted ultrasound is performed, showing a simple cyst in the 2
o'clock location of the LEFT breast 3 centimeters from the nipple
which measures 0.6 x 0.3 x 0.6 centimeters. There is no associated
internal blood flow or solid component.
IMPRESSION: Benign cyst in the LEFT breast. No mammographic or ultrasound
evidence for malignancy.

RECOMMENDATION:
Screening mammogram in one year.(Code:7E-V-OAY)

I have discussed the findings and recommendations with the patient.
If applicable, a reminder letter will be sent to the patient
regarding the next appointment.

BI-RADS CATEGORY  2: Benign.

## 2022-05-16 ENCOUNTER — Ambulatory Visit: Payer: BC Managed Care – PPO | Admitting: Dermatology

## 2022-05-17 ENCOUNTER — Ambulatory Visit: Payer: BC Managed Care – PPO | Admitting: Dermatology

## 2022-05-17 VITALS — BP 129/77 | HR 71

## 2022-05-17 DIAGNOSIS — L821 Other seborrheic keratosis: Secondary | ICD-10-CM

## 2022-05-17 DIAGNOSIS — Z86018 Personal history of other benign neoplasm: Secondary | ICD-10-CM | POA: Diagnosis not present

## 2022-05-17 DIAGNOSIS — D229 Melanocytic nevi, unspecified: Secondary | ICD-10-CM

## 2022-05-17 DIAGNOSIS — D485 Neoplasm of uncertain behavior of skin: Secondary | ICD-10-CM | POA: Diagnosis not present

## 2022-05-17 DIAGNOSIS — L814 Other melanin hyperpigmentation: Secondary | ICD-10-CM

## 2022-05-17 DIAGNOSIS — Z1283 Encounter for screening for malignant neoplasm of skin: Secondary | ICD-10-CM | POA: Diagnosis not present

## 2022-05-17 DIAGNOSIS — L578 Other skin changes due to chronic exposure to nonionizing radiation: Secondary | ICD-10-CM | POA: Diagnosis not present

## 2022-05-17 NOTE — Progress Notes (Signed)
Follow-Up Visit   Subjective  Morgan David is a 61 y.o. female who presents for the following: Annual Exam (Hx dysplastic nevi - has irregular skin lesion on the trunk she would like removed since they are irritated). The patient presents for Total-Body Skin Exam (TBSE) for skin cancer screening and mole check.  The patient has spots, moles and lesions to be evaluated, some may be new or changing and the patient has concerns that these could be cancer.  The following portions of the chart were reviewed this encounter and updated as appropriate:   Tobacco  Allergies  Meds  Problems  Med Hx  Surg Hx  Fam Hx     Review of Systems:  No other skin or systemic complaints except as noted in HPI or Assessment and Plan.  Objective  Well appearing patient in no apparent distress; mood and affect are within normal limits.  A full examination was performed including scalp, head, eyes, ears, nose, lips, neck, chest, axillae, abdomen, back, buttocks, bilateral upper extremities, bilateral lower extremities, hands, feet, fingers, toes, fingernails, and toenails. All findings within normal limits unless otherwise noted below.  L costal area at the side 0.6  red papule.  R ant lat waistline 0.6 cm red papule.  RUQA 0.6 cm red papule.    Assessment & Plan  Neoplasm of uncertain behavior of skin (3) L costal area at the side Epidermal / dermal shaving  Lesion diameter (cm):  0.6 Informed consent: discussed and consent obtained   Timeout: patient name, date of birth, surgical site, and procedure verified   Procedure prep:  Patient was prepped and draped in usual sterile fashion Prep type:  Isopropyl alcohol Anesthesia: the lesion was anesthetized in a standard fashion   Anesthetic:  1% lidocaine w/ epinephrine 1-100,000 buffered w/ 8.4% NaHCO3 Instrument used: flexible razor blade   Hemostasis achieved with: pressure, aluminum chloride and electrodesiccation   Outcome: patient  tolerated procedure well   Post-procedure details: sterile dressing applied and wound care instructions given   Dressing type: bandage and petrolatum    Specimen 1 - Surgical pathology Differential Diagnosis: D48.5 irritated hemangioma r/o dysplasia Check Margins: No  R ant lat waistline Epidermal / dermal shaving  Lesion diameter (cm):  0.6 Informed consent: discussed and consent obtained   Timeout: patient name, date of birth, surgical site, and procedure verified   Procedure prep:  Patient was prepped and draped in usual sterile fashion Prep type:  Isopropyl alcohol Anesthesia: the lesion was anesthetized in a standard fashion   Anesthetic:  1% lidocaine w/ epinephrine 1-100,000 buffered w/ 8.4% NaHCO3 Instrument used: flexible razor blade   Hemostasis achieved with: pressure, aluminum chloride and electrodesiccation   Outcome: patient tolerated procedure well   Post-procedure details: sterile dressing applied and wound care instructions given   Dressing type: bandage and petrolatum    Specimen 2 - Surgical pathology Differential Diagnosis:  D48.5 irritated hemangioma r/o dysplasia Check Margins: No  RUQA Epidermal / dermal shaving  Lesion diameter (cm):  0.6 Informed consent: discussed and consent obtained   Timeout: patient name, date of birth, surgical site, and procedure verified   Procedure prep:  Patient was prepped and draped in usual sterile fashion Prep type:  Isopropyl alcohol Anesthesia: the lesion was anesthetized in a standard fashion   Anesthetic:  1% lidocaine w/ epinephrine 1-100,000 buffered w/ 8.4% NaHCO3 Instrument used: flexible razor blade   Hemostasis achieved with: pressure, aluminum chloride and electrodesiccation   Outcome: patient tolerated  procedure well   Post-procedure details: sterile dressing applied and wound care instructions given   Dressing type: bandage and petrolatum    Specimen 3 - Surgical pathology Differential Diagnosis:  D48.5  irritated hemangioma r/o dysplasia Check Margins: No  Lentigines - Scattered tan macules - Due to sun exposure - Benign-appearing, observe - Recommend daily broad spectrum sunscreen SPF 30+ to sun-exposed areas, reapply every 2 hours as needed. - Call for any changes  Seborrheic Keratoses - Stuck-on, waxy, tan-brown papules and/or plaques  - Benign-appearing - Discussed benign etiology and prognosis. - Observe - Call for any changes  Melanocytic Nevi - Tan-brown and/or pink-flesh-colored symmetric macules and papules - Benign appearing on exam today - Observation - Call clinic for new or changing moles - Recommend daily use of broad spectrum spf 30+ sunscreen to sun-exposed areas.   Hemangiomas - Red papules - Discussed benign nature - Observe - Call for any changes  Actinic Damage - Chronic condition, secondary to cumulative UV/sun exposure - diffuse scaly erythematous macules with underlying dyspigmentation - Recommend daily broad spectrum sunscreen SPF 30+ to sun-exposed areas, reapply every 2 hours as needed.  - Staying in the shade or wearing long sleeves, sun glasses (UVA+UVB protection) and wide brim hats (4-inch brim around the entire circumference of the hat) are also recommended for sun protection.  - Call for new or changing lesions.  History of Dysplastic Nevi - No evidence of recurrence today - Recommend regular full body skin exams - Recommend daily broad spectrum sunscreen SPF 30+ to sun-exposed areas, reapply every 2 hours as needed.  - Call if any new or changing lesions are noted between office visits  Skin cancer screening performed today.  Return in about 1 year (around 05/18/2023) for TBSE.  Luther Redo, CMA, am acting as scribe for Sarina Ser, MD . Documentation: I have reviewed the above documentation for accuracy and completeness, and I agree with the above.  Sarina Ser, MD

## 2022-05-17 NOTE — Patient Instructions (Addendum)
Wound Care Instructions  Cleanse wound gently with soap and water once a day then pat dry with clean gauze. Apply a thin coat of Petrolatum (petroleum jelly, "Vaseline") over the wound (unless you have an allergy to this). We recommend that you use a new, sterile tube of Vaseline. Do not pick or remove scabs. Do not remove the yellow or white "healing tissue" from the base of the wound.  Cover the wound with fresh, clean, nonstick gauze and secure with paper tape. You may use Band-Aids in place of gauze and tape if the wound is small enough, but would recommend trimming much of the tape off as there is often too much. Sometimes Band-Aids can irritate the skin.  You should call the office for your biopsy report after 1 week if you have not already been contacted.  If you experience any problems, such as abnormal amounts of bleeding, swelling, significant bruising, significant pain, or evidence of infection, please call the office immediately.  FOR ADULT SURGERY PATIENTS: If you need something for pain relief you may take 1 extra strength Tylenol (acetaminophen) AND 2 Ibuprofen (200mg each) together every 4 hours as needed for pain. (do not take these if you are allergic to them or if you have a reason you should not take them.) Typically, you may only need pain medication for 1 to 3 days.     Due to recent changes in healthcare laws, you may see results of your pathology and/or laboratory studies on MyChart before the doctors have had a chance to review them. We understand that in some cases there may be results that are confusing or concerning to you. Please understand that not all results are received at the same time and often the doctors may need to interpret multiple results in order to provide you with the best plan of care or course of treatment. Therefore, we ask that you please give us 2 business days to thoroughly review all your results before contacting the office for clarification. Should  we see a critical lab result, you will be contacted sooner.   If You Need Anything After Your Visit  If you have any questions or concerns for your doctor, please call our main line at [phone] and press option 4 to reach your doctor's medical assistant. If no one answers, please leave a voicemail as directed and we will return your call as soon as possible. Messages left after 4 pm will be answered the following business day.   You may also send us a message via MyChart. We typically respond to MyChart messages within 1-2 business days.  For prescription refills, please ask your pharmacy to contact our office. Our fax number is [phone].  If you have an urgent issue when the clinic is closed that cannot wait until the next business day, you can page your doctor at the number below.    Please note that while we do our best to be available for urgent issues outside of office hours, we are not available 24/7.   If you have an urgent issue and are unable to reach us, you may choose to seek medical care at your doctor's office, retail clinic, urgent care center, or emergency room.  If you have a medical emergency, please immediately call 911 or go to the emergency department.  Pager Numbers  - Dr. Kowalski: [phone]  - Dr. Moye: [phone]  - Dr. Stewart: [phone]  In the event of inclement weather, please call our main line at   [phone] for an update on the status of any delays or closures.  Dermatology Medication Tips: Please keep the boxes that topical medications come in in order to help keep track of the instructions about where and how to use these. Pharmacies typically print the medication instructions only on the boxes and not directly on the medication tubes.   If your medication is too expensive, please contact our office at [phone] option 4 or send us a message through MyChart.   We are unable to tell what your co-pay for medications will be in  advance as this is different depending on your insurance coverage. However, we may be able to find a substitute medication at lower cost or fill out paperwork to get insurance to cover a needed medication.   If a prior authorization is required to get your medication covered by your insurance company, please allow us 1-2 business days to complete this process.  Drug prices often vary depending on where the prescription is filled and some pharmacies may offer cheaper prices.  The website www.goodrx.com contains coupons for medications through different pharmacies. The prices here do not account for what the cost may be with help from insurance (it may be cheaper with your insurance), but the website can give you the price if you did not use any insurance.  - You can print the associated coupon and take it with your prescription to the pharmacy.  - You may also stop by our office during regular business hours and pick up a GoodRx coupon card.  - If you need your prescription sent electronically to a different pharmacy, notify our office through [place] MyChart or by phone at [phone] option 4.     Si Usted Necesita Algo Despus de Su Visita  Tambin puede enviarnos un mensaje a travs de MyChart. Por lo general respondemos a los mensajes de MyChart en el transcurso de 1 a 2 das hbiles.  Para renovar recetas, por favor pida a su farmacia que se ponga en contacto con nuestra oficina. Nuestro nmero de fax es el [phone].  Si tiene un asunto urgente cuando la clnica est cerrada y que no puede esperar hasta el siguiente da hbil, puede llamar/localizar a su doctor(a) al nmero que aparece a continuacin.   Por favor, tenga en cuenta que aunque hacemos todo lo posible para estar disponibles para asuntos urgentes fuera del horario de oficina, no estamos disponibles las 24 horas del da, los 7 das de la semana.   Si tiene un problema urgente y no puede comunicarse con nosotros, puede  optar por buscar atencin mdica  en el consultorio de su doctor(a), en una clnica privada, en un centro de atencin urgente o en una sala de emergencias.  Si tiene una emergencia mdica, por favor llame inmediatamente al 911 o vaya a la sala de emergencias.  Nmeros de bper  - Dr. Kowalski: [phone]  - Dra. Moye: [phone]  - Dra. Stewart: [phone]  En caso de inclemencias del tiempo, por favor llame a nuestra lnea principal al [phone] para una actualizacin sobre el estado de cualquier retraso o cierre.  Consejos para la medicacin en dermatologa: Por favor, guarde las cajas en las que vienen los medicamentos de uso tpico para ayudarle a seguir las instrucciones sobre dnde y cmo usarlos. Las farmacias generalmente imprimen las instrucciones del medicamento slo en las cajas y no directamente en los tubos del medicamento.   Si su medicamento es muy caro, por favor, pngase en contacto con   nuestra oficina llamando al [phone] y presione la opcin 4 o envenos un mensaje a travs de MyChart.   No podemos decirle cul ser su copago por los medicamentos por adelantado ya que esto es diferente dependiendo de la cobertura de su seguro. Sin embargo, es posible que podamos encontrar un medicamento sustituto a menor costo o llenar un formulario para que el seguro cubra el medicamento que se considera necesario.   Si se requiere una autorizacin previa para que su compaa de seguros cubra su medicamento, por favor permtanos de 1 a 2 das hbiles para completar este proceso.  Los precios de los medicamentos varan con frecuencia dependiendo del lugar de dnde se surte la receta y alguna farmacias pueden ofrecer precios ms baratos.  El sitio web www.goodrx.com tiene cupones para medicamentos de diferentes farmacias. Los precios aqu no tienen en cuenta lo que podra costar con la ayuda del seguro (puede ser ms barato con su seguro), pero el sitio web puede darle el  precio si no utiliz ningn seguro.  - Puede imprimir el cupn correspondiente y llevarlo con su receta a la farmacia.  - Tambin puede pasar por nuestra oficina durante el horario de atencin regular y recoger una tarjeta de cupones de GoodRx.  - Si necesita que su receta se enve electrnicamente a una farmacia diferente, informe a nuestra oficina a travs de MyChart de [place] o por telfono llamando al [phone] y presione la opcin 4.  

## 2022-05-26 ENCOUNTER — Encounter: Payer: Self-pay | Admitting: Dermatology

## 2022-05-30 ENCOUNTER — Telehealth: Payer: Self-pay

## 2022-05-30 NOTE — Telephone Encounter (Signed)
Advised pt of bx results/sh ?

## 2022-05-30 NOTE — Telephone Encounter (Signed)
-----   Message from Ralene Bathe, MD sent at 05/30/2022  5:01 PM EST ----- Diagnosis 1. Skin , left costal area at the side HEMANGIOMA 2. Skin , right ant lat waistline HEMANGIOMA 3. Skin , RUQA HEMANGIOMA  1,2,3 -all benign Hemangiomas = collection of blood vessels No further treatment needed

## 2022-06-05 ENCOUNTER — Ambulatory Visit: Payer: BC Managed Care – PPO | Admitting: Dermatology

## 2022-07-31 ENCOUNTER — Ambulatory Visit: Payer: BC Managed Care – PPO | Admitting: Dermatology

## 2022-09-04 ENCOUNTER — Ambulatory Visit: Payer: BC Managed Care – PPO | Admitting: Dermatology

## 2022-09-25 ENCOUNTER — Ambulatory Visit (INDEPENDENT_AMBULATORY_CARE_PROVIDER_SITE_OTHER): Payer: Self-pay | Admitting: Dermatology

## 2022-09-25 VITALS — BP 123/77 | HR 87

## 2022-09-25 DIAGNOSIS — L988 Other specified disorders of the skin and subcutaneous tissue: Secondary | ICD-10-CM

## 2022-09-25 NOTE — Progress Notes (Signed)
   Follow-Up Visit   Subjective  Morgan David is a 62 y.o. female who presents for the following: Botox for facial elastosis  The following portions of the chart were reviewed this encounter and updated as appropriate: medications, allergies, medical history  Review of Systems:  No other skin or systemic complaints except as noted in HPI or Assessment and Plan.  Objective  Well appearing patient in no apparent distress; mood and affect are within normal limits.  A focused examination was performed of the face.  Relevant physical exam findings are noted in the Assessment and Plan.                 Assessment & Plan    Facial Elastosis  Botox 25 units injected into the frown complex.   Location: See attached image  Informed consent: Discussed risks (infection, pain, bleeding, bruising, swelling, allergic reaction, paralysis of nearby muscles, eyelid droop, double vision, neck weakness, difficulty breathing, headache, undesirable cosmetic result, and need for additional treatment) and benefits of the procedure, as well as the alternatives.  Informed consent was obtained.  Preparation: The area was cleansed with alcohol.  Procedure Details:  Botox was injected into the dermis with a 30-gauge needle. Pressure applied to any bleeding. Ice packs offered for swelling.  Lot Number:  [id number] Expiration:  09/14/24  Total Units Injected:  25  Plan: Tylenol may be used for headache.  Allow 2 weeks before returning to clinic for additional dosing as needed. Patient will call for any problems.  Return in about 4 weeks (around 10/23/2022) for botox follow up; 3-4 mths for Botox injections.  Maylene Roes, CMA, am acting as scribe for Armida Sans, MD .   Documentation: I have reviewed the above documentation for accuracy and completeness, and I agree with the above.  Armida Sans, MD

## 2022-09-25 NOTE — Patient Instructions (Addendum)
Due to recent changes in healthcare laws, you may see results of your pathology and/or laboratory studies on MyChart before the doctors have had a chance to review them. We understand that in some cases there may be results that are confusing or concerning to you. Please understand that not all results are received at the same time and often the doctors may need to interpret multiple results in order to provide you with the best plan of care or course of treatment. Therefore, we ask that you please give us 2 business days to thoroughly review all your results before contacting the office for clarification. Should we see a critical lab result, you will be contacted sooner.   If You Need Anything After Your Visit  If you have any questions or concerns for your doctor, please call our main line at [phone] and press option 4 to reach your doctor's medical assistant. If no one answers, please leave a voicemail as directed and we will return your call as soon as possible. Messages left after 4 pm will be answered the following business day.   You may also send us a message via MyChart. We typically respond to MyChart messages within 1-2 business days.  For prescription refills, please ask your pharmacy to contact our office. Our fax number is [phone].  If you have an urgent issue when the clinic is closed that cannot wait until the next business day, you can page your doctor at the number below.    Please note that while we do our best to be available for urgent issues outside of office hours, we are not available 24/7.   If you have an urgent issue and are unable to reach us, you may choose to seek medical care at your doctor's office, retail clinic, urgent care center, or emergency room.  If you have a medical emergency, please immediately call 911 or go to the emergency department.  Pager Numbers  - Dr. Kowalski: [phone]  - Dr. Moye: [phone]  - Dr. Stewart:  [phone]  In the event of inclement weather, please call our main line at [phone] for an update on the status of any delays or closures.  Dermatology Medication Tips: Please keep the boxes that topical medications come in in order to help keep track of the instructions about where and how to use these. Pharmacies typically print the medication instructions only on the boxes and not directly on the medication tubes.   If your medication is too expensive, please contact our office at [phone] option 4 or send us a message through MyChart.   We are unable to tell what your co-pay for medications will be in advance as this is different depending on your insurance coverage. However, we may be able to find a substitute medication at lower cost or fill out paperwork to get insurance to cover a needed medication.   If a prior authorization is required to get your medication covered by your insurance company, please allow us 1-2 business days to complete this process.  Drug prices often vary depending on where the prescription is filled and some pharmacies may offer cheaper prices.  The website www.goodrx.com contains coupons for medications through different pharmacies. The prices here do not account for what the cost may be with help from insurance (it may be cheaper with your insurance), but the website can give you the price if you did not use any insurance.  - You can print the associated coupon and take it with   your prescription to the pharmacy.  - You may also stop by our office during regular business hours and pick up a GoodRx coupon card.  - If you need your prescription sent electronically to a different pharmacy, notify our office through [place] MyChart or by phone at [phone] option 4.     Si Usted Necesita Algo Despus de Su Visita  Tambin puede enviarnos un mensaje a travs de MyChart. Por lo general respondemos a los mensajes de MyChart en el transcurso de 1 a 2  das hbiles.  Para renovar recetas, por favor pida a su farmacia que se ponga en contacto con nuestra oficina. Nuestro nmero de fax es el [phone].  Si tiene un asunto urgente cuando la clnica est cerrada y que no puede esperar hasta el siguiente da hbil, puede llamar/localizar a su doctor(a) al nmero que aparece a continuacin.   Por favor, tenga en cuenta que aunque hacemos todo lo posible para estar disponibles para asuntos urgentes fuera del horario de oficina, no estamos disponibles las 24 horas del da, los 7 das de la semana.   Si tiene un problema urgente y no puede comunicarse con nosotros, puede optar por buscar atencin mdica  en el consultorio de su doctor(a), en una clnica privada, en un centro de atencin urgente o en una sala de emergencias.  Si tiene una emergencia mdica, por favor llame inmediatamente al 911 o vaya a la sala de emergencias.  Nmeros de bper  - Dr. Kowalski: [phone]  - Dra. Moye: [phone]  - Dra. Stewart: [phone]  En caso de inclemencias del tiempo, por favor llame a nuestra lnea principal al [phone] para una actualizacin sobre el estado de cualquier retraso o cierre.  Consejos para la medicacin en dermatologa: Por favor, guarde las cajas en las que vienen los medicamentos de uso tpico para ayudarle a seguir las instrucciones sobre dnde y cmo usarlos. Las farmacias generalmente imprimen las instrucciones del medicamento slo en las cajas y no directamente en los tubos del medicamento.   Si su medicamento es muy caro, por favor, pngase en contacto con nuestra oficina llamando al [phone] y presione la opcin 4 o envenos un mensaje a travs de MyChart.   No podemos decirle cul ser su copago por los medicamentos por adelantado ya que esto es diferente dependiendo de la cobertura de su seguro. Sin embargo, es posible que podamos encontrar un medicamento sustituto a menor costo o llenar un formulario para que el  seguro cubra el medicamento que se considera necesario.   Si se requiere una autorizacin previa para que su compaa de seguros cubra su medicamento, por favor permtanos de 1 a 2 das hbiles para completar este proceso.  Los precios de los medicamentos varan con frecuencia dependiendo del lugar de dnde se surte la receta y alguna farmacias pueden ofrecer precios ms baratos.  El sitio web www.goodrx.com tiene cupones para medicamentos de diferentes farmacias. Los precios aqu no tienen en cuenta lo que podra costar con la ayuda del seguro (puede ser ms barato con su seguro), pero el sitio web puede darle el precio si no utiliz ningn seguro.  - Puede imprimir el cupn correspondiente y llevarlo con su receta a la farmacia.  - Tambin puede pasar por nuestra oficina durante el horario de atencin regular y recoger una tarjeta de cupones de GoodRx.  - Si necesita que su receta se enve electrnicamente a una farmacia diferente, informe a nuestra oficina a travs de MyChart de [place]   o por telfono llamando al [phone] y presione la opcin 4.  

## 2022-09-30 ENCOUNTER — Encounter: Payer: Self-pay | Admitting: Dermatology

## 2022-10-24 ENCOUNTER — Ambulatory Visit (INDEPENDENT_AMBULATORY_CARE_PROVIDER_SITE_OTHER): Payer: Self-pay | Admitting: Dermatology

## 2022-10-24 VITALS — BP 125/79 | HR 74

## 2022-10-24 DIAGNOSIS — L988 Other specified disorders of the skin and subcutaneous tissue: Secondary | ICD-10-CM

## 2022-10-24 NOTE — Progress Notes (Signed)
   Follow-Up Visit   Subjective  Morgan David is a 62 y.o. female who presents for the following: Botox for facial elastosis 4 weeks f/u patient c/o lines at the the frown complex area.    The following portions of the chart were reviewed this encounter and updated as appropriate: medications, allergies, medical history  Review of Systems:  No other skin or systemic complaints except as noted in HPI or Assessment and Plan.  Objective  Well appearing patient in no apparent distress; mood and affect are within normal limits.  A focused examination was performed of the face.  Relevant physical exam findings are noted in the Assessment and Plan     Assessment & Plan    Facial Elastosis Lines on forehead may never go completely away  Location: See attached image  Informed consent: Discussed risks (infection, pain, bleeding, bruising, swelling, allergic reaction, paralysis of nearby muscles, eyelid droop, double vision, neck weakness, difficulty breathing, headache, undesirable cosmetic result, and need for additional treatment) and benefits of the procedure, as well as the alternatives.  Informed consent was obtained.  Preparation: The area was cleansed with alcohol.  Procedure Details:  Botox was injected into the dermis with a 30-gauge needle. Pressure applied to any bleeding. Ice packs offered for swelling.  Lot Number:  [id number] Expiration:  09/25/2024  Total Units Injected:  10  Plan: Tylenol may be used for headache.  Allow 2 weeks before returning to clinic for additional dosing as needed. Patient will call for any problems.  Return in about 3 months (around 01/24/2023) for Botox .  IAngelique Holm, CMA, am acting as scribe for Armida Sans, MD .   Documentation: I have reviewed the above documentation for accuracy and completeness, and I agree with the above.  Armida Sans, MD

## 2022-10-24 NOTE — Patient Instructions (Signed)
Due to recent changes in healthcare laws, you may see results of your pathology and/or laboratory studies on MyChart before the doctors have had a chance to review them. We understand that in some cases there may be results that are confusing or concerning to you. Please understand that not all results are received at the same time and often the doctors may need to interpret multiple results in order to provide you with the best plan of care or course of treatment. Therefore, we ask that you please give us 2 business days to thoroughly review all your results before contacting the office for clarification. Should we see a critical lab result, you will be contacted sooner.   If You Need Anything After Your Visit  If you have any questions or concerns for your doctor, please call our main line at [phone] and press option 4 to reach your doctor's medical assistant. If no one answers, please leave a voicemail as directed and we will return your call as soon as possible. Messages left after 4 pm will be answered the following business day.   You may also send us a message via MyChart. We typically respond to MyChart messages within 1-2 business days.  For prescription refills, please ask your pharmacy to contact our office. Our fax number is [phone].  If you have an urgent issue when the clinic is closed that cannot wait until the next business day, you can page your doctor at the number below.    Please note that while we do our best to be available for urgent issues outside of office hours, we are not available 24/7.   If you have an urgent issue and are unable to reach us, you may choose to seek medical care at your doctor's office, retail clinic, urgent care center, or emergency room.  If you have a medical emergency, please immediately call 911 or go to the emergency department.  Pager Numbers  - Dr. Kowalski: [phone]  - Dr. Moye: [phone]  - Dr. Stewart:  [phone]  In the event of inclement weather, please call our main line at [phone] for an update on the status of any delays or closures.  Dermatology Medication Tips: Please keep the boxes that topical medications come in in order to help keep track of the instructions about where and how to use these. Pharmacies typically print the medication instructions only on the boxes and not directly on the medication tubes.   If your medication is too expensive, please contact our office at [phone] option 4 or send us a message through MyChart.   We are unable to tell what your co-pay for medications will be in advance as this is different depending on your insurance coverage. However, we may be able to find a substitute medication at lower cost or fill out paperwork to get insurance to cover a needed medication.   If a prior authorization is required to get your medication covered by your insurance company, please allow us 1-2 business days to complete this process.  Drug prices often vary depending on where the prescription is filled and some pharmacies may offer cheaper prices.  The website www.goodrx.com contains coupons for medications through different pharmacies. The prices here do not account for what the cost may be with help from insurance (it may be cheaper with your insurance), but the website can give you the price if you did not use any insurance.  - You can print the associated coupon and take it with   your prescription to the pharmacy.  - You may also stop by our office during regular business hours and pick up a GoodRx coupon card.  - If you need your prescription sent electronically to a different pharmacy, notify our office through [facility] MyChart or by phone at [phone] option 4.     Si Usted Necesita Algo Despus de Su Visita  Tambin puede enviarnos un mensaje a travs de MyChart. Por lo general respondemos a los mensajes de MyChart en el transcurso de 1 a 2  das hbiles.  Para renovar recetas, por favor pida a su farmacia que se ponga en contacto con nuestra oficina. Nuestro nmero de fax es el [phone].  Si tiene un asunto urgente cuando la clnica est cerrada y que no puede esperar hasta el siguiente da hbil, puede llamar/localizar a su doctor(a) al nmero que aparece a continuacin.   Por favor, tenga en cuenta que aunque hacemos todo lo posible para estar disponibles para asuntos urgentes fuera del horario de oficina, no estamos disponibles las 24 horas del da, los 7 das de la semana.   Si tiene un problema urgente y no puede comunicarse con nosotros, puede optar por buscar atencin mdica  en el consultorio de su doctor(a), en una clnica privada, en un centro de atencin urgente o en una sala de emergencias.  Si tiene una emergencia mdica, por favor llame inmediatamente al 911 o vaya a la sala de emergencias.  Nmeros de bper  - Dr. Kowalski: [phone]  - Dra. Moye: [phone]  - Dra. Stewart: [phone]  En caso de inclemencias del tiempo, por favor llame a nuestra lnea principal al [phone] para una actualizacin sobre el estado de cualquier retraso o cierre.  Consejos para la medicacin en dermatologa: Por favor, guarde las cajas en las que vienen los medicamentos de uso tpico para ayudarle a seguir las instrucciones sobre dnde y cmo usarlos. Las farmacias generalmente imprimen las instrucciones del medicamento slo en las cajas y no directamente en los tubos del medicamento.   Si su medicamento es muy caro, por favor, pngase en contacto con nuestra oficina llamando al [phone] y presione la opcin 4 o envenos un mensaje a travs de MyChart.   No podemos decirle cul ser su copago por los medicamentos por adelantado ya que esto es diferente dependiendo de la cobertura de su seguro. Sin embargo, es posible que podamos encontrar un medicamento sustituto a menor costo o llenar un formulario para que el  seguro cubra el medicamento que se considera necesario.   Si se requiere una autorizacin previa para que su compaa de seguros cubra su medicamento, por favor permtanos de 1 a 2 das hbiles para completar este proceso.  Los precios de los medicamentos varan con frecuencia dependiendo del lugar de dnde se surte la receta y alguna farmacias pueden ofrecer precios ms baratos.  El sitio web www.goodrx.com tiene cupones para medicamentos de diferentes farmacias. Los precios aqu no tienen en cuenta lo que podra costar con la ayuda del seguro (puede ser ms barato con su seguro), pero el sitio web puede darle el precio si no utiliz ningn seguro.  - Puede imprimir el cupn correspondiente y llevarlo con su receta a la farmacia.  - Tambin puede pasar por nuestra oficina durante el horario de atencin regular y recoger una tarjeta de cupones de GoodRx.  - Si necesita que su receta se enve electrnicamente a una farmacia diferente, informe a nuestra oficina a travs de MyChart de [facility]   o por telfono llamando al [phone] y presione la opcin 4.  

## 2022-10-31 ENCOUNTER — Encounter: Payer: Self-pay | Admitting: Dermatology

## 2022-12-18 ENCOUNTER — Ambulatory Visit: Payer: BC Managed Care – PPO | Admitting: Dermatology

## 2023-01-22 ENCOUNTER — Ambulatory Visit: Payer: BC Managed Care – PPO | Admitting: Dermatology

## 2023-05-09 ENCOUNTER — Ambulatory Visit: Payer: BC Managed Care – PPO | Admitting: Dermatology

## 2023-05-09 DIAGNOSIS — D3611 Benign neoplasm of peripheral nerves and autonomic nervous system of face, head, and neck: Secondary | ICD-10-CM | POA: Diagnosis not present

## 2023-05-09 DIAGNOSIS — Z1283 Encounter for screening for malignant neoplasm of skin: Secondary | ICD-10-CM | POA: Diagnosis not present

## 2023-05-09 DIAGNOSIS — D229 Melanocytic nevi, unspecified: Secondary | ICD-10-CM

## 2023-05-09 DIAGNOSIS — W908XXA Exposure to other nonionizing radiation, initial encounter: Secondary | ICD-10-CM

## 2023-05-09 DIAGNOSIS — L578 Other skin changes due to chronic exposure to nonionizing radiation: Secondary | ICD-10-CM

## 2023-05-09 DIAGNOSIS — D485 Neoplasm of uncertain behavior of skin: Secondary | ICD-10-CM

## 2023-05-09 DIAGNOSIS — L820 Inflamed seborrheic keratosis: Secondary | ICD-10-CM

## 2023-05-09 DIAGNOSIS — D492 Neoplasm of unspecified behavior of bone, soft tissue, and skin: Secondary | ICD-10-CM | POA: Diagnosis not present

## 2023-05-09 DIAGNOSIS — L821 Other seborrheic keratosis: Secondary | ICD-10-CM

## 2023-05-09 DIAGNOSIS — L814 Other melanin hyperpigmentation: Secondary | ICD-10-CM

## 2023-05-09 DIAGNOSIS — D1801 Hemangioma of skin and subcutaneous tissue: Secondary | ICD-10-CM

## 2023-05-09 DIAGNOSIS — L819 Disorder of pigmentation, unspecified: Secondary | ICD-10-CM

## 2023-05-09 DIAGNOSIS — Z86018 Personal history of other benign neoplasm: Secondary | ICD-10-CM

## 2023-05-09 DIAGNOSIS — L810 Postinflammatory hyperpigmentation: Secondary | ICD-10-CM

## 2023-05-09 DIAGNOSIS — Z7189 Other specified counseling: Secondary | ICD-10-CM

## 2023-05-09 NOTE — Patient Instructions (Addendum)

## 2023-05-09 NOTE — Progress Notes (Signed)
Follow-Up Visit   Subjective  Morgan David is a 62 y.o. female who presents for the following: Skin Cancer Screening and Full Body Skin Exam  The patient presents for Total-Body Skin Exam (TBSE) for skin cancer screening and mole check. The patient has spots, moles and lesions to be evaluated, some may be new or changing and the patient may have concern these could be cancer.  The following portions of the chart were reviewed this encounter and updated as appropriate: medications, allergies, medical history  Review of Systems:  No other skin or systemic complaints except as noted in HPI or Assessment and Plan.  Objective  Well appearing patient in no apparent distress; mood and affect are within normal limits.  A full examination was performed including scalp, head, eyes, ears, nose, lips, neck, chest, axillae, abdomen, back, buttocks, bilateral upper extremities, bilateral lower extremities, hands, feet, fingers, toes, fingernails, and toenails. All findings within normal limits unless otherwise noted below.   Relevant physical exam findings are noted in the Assessment and Plan.  L post shoulder x 1, R post shoulder x 1, chest x 1 (3) Pedunculated erythematous keratotic or waxy stuck-on papule or plaque.  R lat neck 0.6 cm flesh colored papule.  Assessment & Plan   SKIN CANCER SCREENING PERFORMED TODAY.  ACTINIC DAMAGE - Chronic condition, secondary to cumulative UV/sun exposure - diffuse scaly erythematous macules with underlying dyspigmentation - Recommend daily broad spectrum sunscreen SPF 30+ to sun-exposed areas, reapply every 2 hours as needed.  - Staying in the shade or wearing long sleeves, sun glasses (UVA+UVB protection) and wide brim hats (4-inch brim around the entire circumference of the hat) are also recommended for sun protection.  - Call for new or changing lesions.  LENTIGINES, SEBORRHEIC KERATOSES, HEMANGIOMAS - Benign normal skin lesions - Counseling for  BBL / IPL / Laser and Coordination of Care Discussed the treatment option of Broad Band Light (BBL) /Intense Pulsed Light (IPL)/ Laser for skin discoloration, including brown spots and redness.  Typically we recommend at least 1-3 treatment sessions about 5-8 weeks apart for best results.  Cannot have tanned skin when BBL performed, and regular use of sunscreen/photoprotection is advised after the procedure to help maintain results. The patient's condition may also require "maintenance treatments" in the future.  The fee for BBL / laser treatments is $350 per treatment session for the whole face.  A fee can be quoted for other parts of the body.  Insurance typically does not pay for BBL/laser treatments and therefore the fee is an out-of-pocket cost. Recommend prophylactic valtrex treatment. Once scheduled for procedure, will send Rx in prior to patient's appointment.  - Benign-appearing - Call for any changes  MELANOCYTIC NEVI - Tan-brown and/or pink-flesh-colored symmetric macules and papules - Benign appearing on exam today - Observation - Call clinic for new or changing moles - Recommend daily use of broad spectrum spf 30+ sunscreen to sun-exposed areas.   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  Call if any new or changing lesions are noted between office visits INFLAMED SEBORRHEIC KERATOSIS (3) L post shoulder x 1, R post shoulder x 1, chest x 1 (3) Symptomatic, irritating, patient would like treated.  Destruction of lesion - L post shoulder x 1, R post shoulder x 1, chest x 1 (3) Complexity: simple   Destruction method:  cryotherapy   Informed consent: discussed and consent obtained   Timeout:  patient name, date of birth, surgical site, and procedure verified Lesion destroyed using liquid nitrogen: Yes   Region frozen  until ice ball extended beyond lesion: Yes   Outcome: patient tolerated procedure well with no complications   Post-procedure details: wound care instructions given   NEOPLASM OF UNCERTAIN BEHAVIOR OF SKIN R lat neck Epidermal / dermal shaving  Lesion diameter (cm):  0.6 Informed consent: discussed and consent obtained   Timeout: patient name, date of birth, surgical site, and procedure verified   Procedure prep:  Patient was prepped and draped in usual sterile fashion Prep type:  Isopropyl alcohol Anesthesia: the lesion was anesthetized in a standard fashion   Anesthetic:  1% lidocaine w/ epinephrine 1-100,000 buffered w/ 8.4% NaHCO3 Instrument used: flexible razor blade   Hemostasis achieved with: pressure, aluminum chloride and electrodesiccation   Outcome: patient tolerated procedure well   Post-procedure details: sterile dressing applied and wound care instructions given   Dressing type: bandage and petrolatum   Specimen 1 - Surgical pathology Differential Diagnosis: D48.5 irritated nevus r/o dysplasia Check Margins: Yes  POST-INFLAMMATORY HYPERPIGMENTATION (PIH) Exam: hyperpigmented macules and/or patches at face   This is a benign condition that comes from having previous inflammation in the skin and will fade with time over months to sometimes years. Recommend daily sun protection including sunscreen SPF 30+ to sun-exposed areas. - Recommend treating any itchy or red areas on the skin quickly to prevent new areas of PIH. Treating with prescription medicines such as hydroquinone may help fade dark spots faster.    Treatment Plan:  No tx needed, will resolve on its own over time.  Return in about 1 year (around 05/08/2024) for TBSE.  Maylene Roes, CMA, am acting as scribe for Armida Sans, MD .  Documentation: I have reviewed the above documentation for accuracy and completeness, and I agree with the above.  Armida Sans, MD

## 2023-05-14 ENCOUNTER — Encounter: Payer: Self-pay | Admitting: Dermatology

## 2023-05-14 ENCOUNTER — Telehealth: Payer: Self-pay

## 2023-05-14 LAB — SURGICAL PATHOLOGY

## 2023-05-14 NOTE — Telephone Encounter (Addendum)
Called and discussed pathology results with patient. She verbalized understanding and denied further questions.   ----- Message from Armida Sans sent at 05/14/2023  5:49 PM EST ----- FINAL DIAGNOSIS        1. Skin, R lat neck :       NEUROFIBROMA, IRRITATED, DEEP MARGIN INVOLVED   Benign Neurofibroma = a type of harmless "mole" No further treatment needed

## 2023-10-17 ENCOUNTER — Ambulatory Visit: Admitting: Dermatology

## 2023-10-24 ENCOUNTER — Ambulatory Visit: Payer: BC Managed Care – PPO | Admitting: Dermatology

## 2023-12-04 ENCOUNTER — Ambulatory Visit: Admitting: Dermatology

## 2024-01-01 ENCOUNTER — Encounter: Payer: Self-pay | Admitting: Dermatology

## 2024-01-01 ENCOUNTER — Ambulatory Visit: Admitting: Dermatology

## 2024-01-01 DIAGNOSIS — L820 Inflamed seborrheic keratosis: Secondary | ICD-10-CM | POA: Diagnosis not present

## 2024-01-01 DIAGNOSIS — D1801 Hemangioma of skin and subcutaneous tissue: Secondary | ICD-10-CM

## 2024-01-01 DIAGNOSIS — L988 Other specified disorders of the skin and subcutaneous tissue: Secondary | ICD-10-CM

## 2024-01-01 NOTE — Progress Notes (Signed)
 Follow-Up Visit   Subjective  Morgan David is a 63 y.o. 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 Also reports a rough spot at right lower abdomen.   The patient has spots, moles and lesions to be evaluated, some may be new or changing and the patient may have concern these could be cancer.  The following portions of the chart were reviewed this encounter and updated as appropriate: medications, allergies, medical history  Review of Systems:  No other skin or systemic complaints except as noted in HPI or Assessment and Plan.  Objective  Well appearing patient in no apparent distress; mood and affect are within normal limits.  A focused examination was performed of the face, chest, abdomen, back, left leg, right inguinal area  Relevant physical exam findings are noted in the Assessment and Plan. Hemangioma at trunk           Injection map photo    right inguinal area x 1 Erythematous stuck-on, waxy papule or plaque  Assessment & Plan   INFLAMED SEBORRHEIC KERATOSIS right inguinal area x 1 Symptomatic, irritating, patient would like treated. Destruction of lesion - right inguinal area x 1 Complexity: simple   Destruction method: cryotherapy   Informed consent: discussed and consent obtained   Timeout:  patient name, date of birth, surgical site, and procedure verified Lesion destroyed using liquid nitrogen: Yes   Region frozen until ice ball extended beyond lesion: Yes   Outcome: patient tolerated procedure well with no complications   Post-procedure details: wound care instructions given    HEMANGIOMA OF SKIN   ELASTOSIS OF SKIN   Facial Elastosis 10 units of botox total Forehead - 5 units Frown Complex 2.5 u x 2 = 5 u Location: See attached image Discussed in future could add 20 more units to area   Informed consent: Discussed risks  (infection, pain, bleeding, bruising, swelling, allergic reaction, paralysis of nearby muscles, eyelid droop, double vision, neck weakness, difficulty breathing, headache, undesirable cosmetic result, and need for additional treatment) and benefits of the procedure, as well as the alternatives.  Informed consent was obtained.  Preparation: The area was cleansed with alcohol.  Procedure Details:  Botox was injected into the dermis with a 30-gauge needle. Pressure applied to any bleeding. Ice packs offered for swelling.  Lot Number:  [id number] Expiration:  09/2025  Total Units Injected: 10   Plan: Tylenol may be used for headache.  Allow 2 weeks before returning to clinic for additional dosing as needed. Patient will call for any problems.  HEMANGIOMA Exam: red papules at chest, back, abdomen, legs  Treatment -  BBL today   Laser safety: Patient was advised in laser safety.  Patient was fitted with laser safety goggles and advised to keep eyes closed during procedure with goggles on. Staff and provider ensured that patient and their own safety goggles were also on and eyes protected during procedure. Laser room door was secured and locked from the inside. Laser room door has laser safety sign affixed to the outside of the door.   Patient tolerated the procedure well.   [place] avoidance was stressed. The patient will call with any problems, questions or concerns prior to their next appointment.   [place] BBL - 01/01/24 1600      Patient Details   Skin Type: I    Anesthestic Cream Applied: No  Photo Takes: Yes    Consent Signed: Yes      Treatment Details   Date: 01/01/24    Treatment #: 1    Area: chest, abdomen, back, left leg    Filter: 1st Pass      1st Pass   Location: C   chest, abdomen, back, left leg   Device: 560   hemangiomas at chest, abdomen, left leg and back   BBL j/cm2: 26    PW Msec Sec: 20    Target Temp: 20    Pulses: 157    7mm: this one      SEBORRHEIC  KERATOSIS - Stuck-on, waxy, tan-brown papules and/or plaques  - Benign-appearing - Discussed benign etiology and prognosis. - Observe - Call for any changes  LENTIGINES Exam: scattered tan macules Due to sun exposure Treatment Plan: Benign-appearing, observe. Recommend daily broad spectrum sunscreen SPF 30+ to sun-exposed areas, reapply every 2 hours as needed.  Call for any changes  ACTINIC DAMAGE - chronic, secondary to cumulative UV radiation exposure/sun exposure over time - diffuse scaly erythematous macules with underlying dyspigmentation - Recommend daily broad spectrum sunscreen SPF 30+ to sun-exposed areas, reapply every 2 hours as needed.  - Recommend staying in the shade or wearing long sleeves, sun glasses (UVA+UVB protection) and wide brim hats (4-inch brim around the entire circumference of the hat). - Call for new or changing lesions.   Return for 6 week follow up on irregular moles at back, 3 month botox .  IEleanor Blush, CMA, am acting as scribe for Alm Rhyme, MD.   Documentation: I have reviewed the above documentation for accuracy and completeness, and I agree with the above.  Alm Rhyme, MD

## 2024-01-01 NOTE — Patient Instructions (Addendum)

## 2024-02-12 ENCOUNTER — Ambulatory Visit: Admitting: Dermatology

## 2024-04-14 ENCOUNTER — Encounter: Payer: Self-pay | Admitting: Dermatology

## 2024-04-14 ENCOUNTER — Ambulatory Visit (INDEPENDENT_AMBULATORY_CARE_PROVIDER_SITE_OTHER): Admitting: Dermatology

## 2024-04-14 DIAGNOSIS — L820 Inflamed seborrheic keratosis: Secondary | ICD-10-CM | POA: Diagnosis not present

## 2024-04-14 DIAGNOSIS — L821 Other seborrheic keratosis: Secondary | ICD-10-CM | POA: Diagnosis not present

## 2024-04-14 DIAGNOSIS — D225 Melanocytic nevi of trunk: Secondary | ICD-10-CM | POA: Diagnosis not present

## 2024-04-14 DIAGNOSIS — L578 Other skin changes due to chronic exposure to nonionizing radiation: Secondary | ICD-10-CM

## 2024-04-14 DIAGNOSIS — L814 Other melanin hyperpigmentation: Secondary | ICD-10-CM | POA: Diagnosis not present

## 2024-04-14 DIAGNOSIS — D1801 Hemangioma of skin and subcutaneous tissue: Secondary | ICD-10-CM

## 2024-04-14 DIAGNOSIS — L988 Other specified disorders of the skin and subcutaneous tissue: Secondary | ICD-10-CM

## 2024-04-14 DIAGNOSIS — D229 Melanocytic nevi, unspecified: Secondary | ICD-10-CM

## 2024-04-14 DIAGNOSIS — Z86018 Personal history of other benign neoplasm: Secondary | ICD-10-CM

## 2024-04-14 DIAGNOSIS — Z1283 Encounter for screening for malignant neoplasm of skin: Secondary | ICD-10-CM

## 2024-04-14 DIAGNOSIS — D492 Neoplasm of unspecified behavior of bone, soft tissue, and skin: Secondary | ICD-10-CM

## 2024-04-14 DIAGNOSIS — W908XXA Exposure to other nonionizing radiation, initial encounter: Secondary | ICD-10-CM | POA: Diagnosis not present

## 2024-04-14 NOTE — Progress Notes (Unsigned)
 Follow-Up Visit   Subjective  Morgan David is a 63 y.o. female who presents for the following: Skin Cancer Screening and Full Body Skin Exam, hx of Dysplastic Nevi, hx of Aks, check spot R ant shoulder, ~11m, bra strap irritates, Botox today  The patient presents for Total-Body Skin Exam (TBSE) for skin cancer screening and mole check. The patient has spots, moles and lesions to be evaluated, some may be new or changing and the patient may have concern these could be cancer.  The following portions of the chart were reviewed this encounter and updated as appropriate: medications, allergies, medical history  Review of Systems:  No other skin or systemic complaints except as noted in HPI or Assessment and Plan.  Objective  Well appearing patient in no apparent distress; mood and affect are within normal limits.  A full examination was performed including scalp, head, eyes, ears, nose, lips, neck, chest, axillae, abdomen, back, buttocks, bilateral upper extremities, bilateral lower extremities, hands, feet, fingers, toes, fingernails, and toenails. All findings within normal limits unless otherwise noted below.   Relevant physical exam findings are noted in the Assessment and Plan.  Botox Injection map  R ant shoulder x 1, R forearm x 1, R lower leg x 1 (3) Stuck on waxy paps with erythema L low back paraspinal superior Irregular brown macule 0.6cm  L low back paraspinal inferior 0.3cm irregular brown macule  L low back spinal 0.3cm irregular brown macule   Assessment & Plan   SKIN CANCER SCREENING PERFORMED TODAY.  ACTINIC DAMAGE - Chronic condition, secondary to cumulative UV/sun exposure - diffuse scaly erythematous macules with underlying dyspigmentation - Recommend daily broad spectrum sunscreen SPF 30+ to sun-exposed areas, reapply every 2 hours as needed.  - Staying in the shade or wearing long sleeves, sun glasses (UVA+UVB protection) and wide brim hats (4-inch  brim around the entire circumference of the hat) are also recommended for sun protection.  - Call for new or changing lesions.  LENTIGINES, SEBORRHEIC KERATOSES, HEMANGIOMAS - Benign normal skin lesions - Benign-appearing - Call for any changes  MELANOCYTIC NEVI - Tan-brown and/or pink-flesh-colored symmetric macules and papules - Benign appearing on exam today - Observation - Call clinic for new or changing moles - Recommend daily use of broad spectrum spf 30+ sunscreen to sun-exposed areas.   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  Call if any new or changing lesions are noted between office visits  - R prox lat elbow, LLQA 8.0cm from umbilicus, R chest parasternal  HISTORY OF PRECANCEROUS ACTINIC KERATOSIS - site(s) of PreCancerous Actinic Keratosis clear today. - these may recur and new lesions may form requiring treatment to prevent transformation into skin cancer - observe for new or changing spots and contact [facility] for appointment if occur - photoprotection with sun protective clothing; sunglasses and broad spectrum sunscreen with SPF of at least 30 + and frequent self skin exams recommended - yearly exams by a dermatologist recommended for persons with history of PreCancerous Actinic Keratoses   FACIAL ELASTOSIS Exam: Rhytides and volume loss. Treatment Plan: Botox 35 units injected today to: - Frown complex 30 units - Forehead 5 units Location: frown complex, forehead  Informed consent: Discussed risks (infection, pain, bleeding, bruising, swelling, allergic reaction, paralysis of nearby muscles, eyelid droop, double vision, neck weakness, difficulty breathing, headache, undesirable  cosmetic result, and need for additional treatment) and benefits of the procedure, as well as the alternatives.   Informed consent was obtained.  Preparation: The area was cleansed with alcohol.  Procedure Details:  Botox was injected into the dermis with a 30-gauge needle. Pressure applied to any bleeding. Ice packs offered for swelling.  Lot Number:  [id number] Expiration:  02/2026  Total Units Injected:  35  Plan: Tylenol may be used for headache.  Allow 2 weeks before returning to clinic for additional dosing as needed. Patient will call for any problems.  INFLAMED SEBORRHEIC KERATOSIS (3) R ant shoulder x 1, R forearm x 1, R lower leg x 1 (3) Symptomatic, irritating, patient would like treated. Destruction of lesion - R ant shoulder x 1, R forearm x 1, R lower leg x 1 (3) Complexity: simple   Destruction method: cryotherapy   Informed consent: discussed and consent obtained   Timeout:  patient name, date of birth, surgical site, and procedure verified Lesion destroyed using liquid nitrogen: Yes   Region frozen until ice ball extended beyond lesion: Yes   Outcome: patient tolerated procedure well with no complications   Post-procedure details: wound care instructions given    NEOPLASM OF SKIN (3) L low back paraspinal superior Epidermal / dermal shaving  Lesion diameter (cm):  0.6 Informed consent: discussed and consent obtained   Timeout: patient name, date of birth, surgical site, and procedure verified   Procedure prep:  Patient was prepped and draped in usual sterile fashion Prep type:  Isopropyl alcohol Anesthesia: the lesion was anesthetized in a standard fashion   Anesthetic:  1% lidocaine  w/ epinephrine 1-100,000 buffered w/ 8.4% NaHCO3 Instrument used: flexible razor blade   Hemostasis achieved with: pressure, aluminum chloride and electrodesiccation   Outcome: patient tolerated procedure well   Post-procedure details: sterile dressing applied and wound care instructions given   Dressing type: bandage and petrolatum    Specimen 1 - Surgical pathology Differential Diagnosis:  Nevus vs Dysplastic Nevus  Check Margins: yes Irregular brown macule 0.6cm L low back paraspinal inferior Epidermal / dermal shaving  Lesion diameter (cm):  0.3 Informed consent: discussed and consent obtained   Timeout: patient name, date of birth, surgical site, and procedure verified   Procedure prep:  Patient was prepped and draped in usual sterile fashion Prep type:  Isopropyl alcohol Anesthesia: the lesion was anesthetized in a standard fashion   Anesthetic:  1% lidocaine  w/ epinephrine 1-100,000 buffered w/ 8.4% NaHCO3 Instrument used: flexible razor blade   Hemostasis achieved with: pressure, aluminum chloride and electrodesiccation   Outcome: patient tolerated procedure well   Post-procedure details: sterile dressing applied and wound care instructions given   Dressing type: bandage and petrolatum    Specimen 2 - Surgical pathology Differential Diagnosis: Nevus vs Dysplastic Nevus  Check Margins: yes 0.3cm irregular brown macule L low back spinal Epidermal / dermal shaving  Lesion diameter (cm):  0.3 Informed consent: discussed and consent obtained   Timeout: patient name, date of birth, surgical site, and procedure verified   Procedure prep:  Patient was prepped and draped in usual sterile fashion Prep type:  Isopropyl alcohol Anesthesia: the lesion was anesthetized in a standard fashion   Anesthetic:  1% lidocaine  w/ epinephrine 1-100,000 buffered w/ 8.4% NaHCO3 Instrument used: flexible razor blade   Hemostasis achieved with: pressure, aluminum chloride and electrodesiccation   Outcome: patient tolerated procedure well   Post-procedure details: sterile dressing applied and wound care instructions given   Dressing  type: bandage and petrolatum    Specimen 3 - Surgical pathology Differential Diagnosis: Nevus vs Dysplastic Nevus  Check Margins: yes 0.3cm irregular brown macule Return for 3-67m Botox, 1 yr TBSE, Hx of Dysplastic nevi, Hx of AKs.  I, Grayce Saunas,  RMA, am acting as scribe for Alm Rhyme, MD .   Documentation: I have reviewed the above documentation for accuracy and completeness, and I agree with the above.  Alm Rhyme, MD

## 2024-04-14 NOTE — Patient Instructions (Addendum)

## 2024-04-15 ENCOUNTER — Encounter: Payer: Self-pay | Admitting: Dermatology

## 2024-04-16 LAB — SURGICAL PATHOLOGY

## 2024-04-17 ENCOUNTER — Ambulatory Visit: Payer: Self-pay | Admitting: Dermatology

## 2024-04-20 ENCOUNTER — Encounter: Payer: Self-pay | Admitting: Dermatology

## 2024-04-20 NOTE — Telephone Encounter (Addendum)
 Called and discussed bx results with patient. She verbalized understanding and denied further questions at this time. Will recheck areas at next follow up   ----- Message from Alm Rhyme sent at 04/17/2024 12:21 PM EST ----- FINAL DIAGNOSIS        1. Skin, left low back paraspinal superior :       DYSPLASTIC JUNCTIONAL NEVUS WITH MODERATE ATYPIA, CLOSE TO MARGIN        2. Skin, left low back paraspinal inferior :       DYSPLASTIC JUNCTIONAL LENTIGINOUS NEVUS WITH MODERATE ATYPIA, CLOSE TO MARGIN        3. Skin, left low back spinal :       DYSPLASTIC JUNCTIONAL NEVUS WITH MILD ATYPIA, LIMITED MARGINS FREE    1&2 - both Moderate Dysplastic Recheck next visit 3- Mild Dysplastic Recheck next visit ----- Message ----- From: Interface, [facility] Sent: 04/16/2024   5:39 PM EST To: Alm JAYSON Rhyme, MD

## 2024-05-13 ENCOUNTER — Ambulatory Visit: Admitting: Dermatology

## 2024-05-13 ENCOUNTER — Ambulatory Visit: Payer: BC Managed Care – PPO | Admitting: Dermatology

## 2024-07-14 ENCOUNTER — Ambulatory Visit: Admitting: Dermatology

## 2024-07-22 ENCOUNTER — Ambulatory Visit: Admitting: Dermatology

## 2025-04-20 ENCOUNTER — Ambulatory Visit: Admitting: Dermatology
